# Patient Record
Sex: Male | Born: 1965 | Race: White | Hispanic: No | Marital: Married | Smoking: Never smoker
Health system: Southern US, Community
[De-identification: ages and names within clinical notes are randomized; demographics above are authoritative.]

## PROBLEM LIST (undated history)

## (undated) DIAGNOSIS — M549 Dorsalgia, unspecified: Secondary | ICD-10-CM

## (undated) DIAGNOSIS — Z9989 Dependence on other enabling machines and devices: Secondary | ICD-10-CM

## (undated) DIAGNOSIS — Z8619 Personal history of other infectious and parasitic diseases: Secondary | ICD-10-CM

## (undated) DIAGNOSIS — T7840XA Allergy, unspecified, initial encounter: Secondary | ICD-10-CM

## (undated) DIAGNOSIS — R12 Heartburn: Secondary | ICD-10-CM

## (undated) DIAGNOSIS — R1310 Dysphagia, unspecified: Secondary | ICD-10-CM

## (undated) DIAGNOSIS — M2550 Pain in unspecified joint: Secondary | ICD-10-CM

## (undated) DIAGNOSIS — Z87442 Personal history of urinary calculi: Secondary | ICD-10-CM

## (undated) DIAGNOSIS — K219 Gastro-esophageal reflux disease without esophagitis: Secondary | ICD-10-CM

## (undated) DIAGNOSIS — E7800 Pure hypercholesterolemia, unspecified: Secondary | ICD-10-CM

## (undated) DIAGNOSIS — G4733 Obstructive sleep apnea (adult) (pediatric): Secondary | ICD-10-CM

## (undated) DIAGNOSIS — N189 Chronic kidney disease, unspecified: Secondary | ICD-10-CM

## (undated) DIAGNOSIS — G4730 Sleep apnea, unspecified: Secondary | ICD-10-CM

## (undated) DIAGNOSIS — R0602 Shortness of breath: Secondary | ICD-10-CM

## (undated) DIAGNOSIS — J302 Other seasonal allergic rhinitis: Secondary | ICD-10-CM

## (undated) DIAGNOSIS — I10 Essential (primary) hypertension: Secondary | ICD-10-CM

## (undated) DIAGNOSIS — M7989 Other specified soft tissue disorders: Secondary | ICD-10-CM

## (undated) HISTORY — DX: Dependence on other enabling machines and devices: Z99.89

## (undated) HISTORY — DX: Dorsalgia, unspecified: M54.9

## (undated) HISTORY — DX: Pain in unspecified joint: M25.50

## (undated) HISTORY — PX: UPPER GASTROINTESTINAL ENDOSCOPY: SHX188

## (undated) HISTORY — DX: Other specified soft tissue disorders: M79.89

## (undated) HISTORY — DX: Allergy, unspecified, initial encounter: T78.40XA

## (undated) HISTORY — PX: INNER EAR SURGERY: SHX679

## (undated) HISTORY — DX: Personal history of other infectious and parasitic diseases: Z86.19

## (undated) HISTORY — DX: Shortness of breath: R06.02

## (undated) HISTORY — DX: Essential (primary) hypertension: I10

## (undated) HISTORY — DX: Sleep apnea, unspecified: G47.30

## (undated) HISTORY — DX: Dysphagia, unspecified: R13.10

## (undated) HISTORY — DX: Morbid (severe) obesity due to excess calories: E66.01

## (undated) HISTORY — DX: Heartburn: R12

## (undated) HISTORY — DX: Chronic kidney disease, unspecified: N18.9

## (undated) HISTORY — DX: Obstructive sleep apnea (adult) (pediatric): G47.33

## (undated) HISTORY — DX: Pure hypercholesterolemia, unspecified: E78.00

## (undated) HISTORY — DX: Personal history of urinary calculi: Z87.442

---

## 2008-06-27 ENCOUNTER — Emergency Department (HOSPITAL_BASED_OUTPATIENT_CLINIC_OR_DEPARTMENT_OTHER): Admission: EM | Admit: 2008-06-27 | Discharge: 2008-06-27 | Payer: Self-pay | Admitting: Emergency Medicine

## 2008-06-27 ENCOUNTER — Ambulatory Visit: Payer: Self-pay | Admitting: Diagnostic Radiology

## 2011-06-30 ENCOUNTER — Emergency Department (INDEPENDENT_AMBULATORY_CARE_PROVIDER_SITE_OTHER): Payer: PRIVATE HEALTH INSURANCE

## 2011-06-30 ENCOUNTER — Emergency Department (HOSPITAL_BASED_OUTPATIENT_CLINIC_OR_DEPARTMENT_OTHER)
Admission: EM | Admit: 2011-06-30 | Discharge: 2011-06-30 | Disposition: A | Discharge status: Home / Self Care | Payer: PRIVATE HEALTH INSURANCE | Attending: Emergency Medicine | Admitting: Emergency Medicine

## 2011-06-30 ENCOUNTER — Encounter (HOSPITAL_BASED_OUTPATIENT_CLINIC_OR_DEPARTMENT_OTHER): Payer: Self-pay | Admitting: *Deleted

## 2011-06-30 DIAGNOSIS — N200 Calculus of kidney: Secondary | ICD-10-CM

## 2011-06-30 DIAGNOSIS — N201 Calculus of ureter: Secondary | ICD-10-CM

## 2011-06-30 DIAGNOSIS — M5137 Other intervertebral disc degeneration, lumbosacral region: Secondary | ICD-10-CM | POA: Insufficient documentation

## 2011-06-30 DIAGNOSIS — M51379 Other intervertebral disc degeneration, lumbosacral region without mention of lumbar back pain or lower extremity pain: Secondary | ICD-10-CM | POA: Insufficient documentation

## 2011-06-30 DIAGNOSIS — N23 Unspecified renal colic: Secondary | ICD-10-CM

## 2011-06-30 DIAGNOSIS — M5126 Other intervertebral disc displacement, lumbar region: Secondary | ICD-10-CM

## 2011-06-30 DIAGNOSIS — R109 Unspecified abdominal pain: Secondary | ICD-10-CM | POA: Insufficient documentation

## 2011-06-30 DIAGNOSIS — N2889 Other specified disorders of kidney and ureter: Secondary | ICD-10-CM

## 2011-06-30 HISTORY — DX: Other seasonal allergic rhinitis: J30.2

## 2011-06-30 HISTORY — DX: Gastro-esophageal reflux disease without esophagitis: K21.9

## 2011-06-30 LAB — URINALYSIS, ROUTINE W REFLEX MICROSCOPIC
Ketones, ur: NEGATIVE mg/dL
Leukocytes, UA: NEGATIVE
Nitrite: NEGATIVE
Protein, ur: NEGATIVE mg/dL
Urobilinogen, UA: 0.2 mg/dL (ref 0.0–1.0)

## 2011-06-30 LAB — BASIC METABOLIC PANEL
Calcium: 9.2 mg/dL (ref 8.4–10.5)
Creatinine, Ser: 1.1 mg/dL (ref 0.50–1.35)
GFR calc Af Amer: >90 mL/min (ref >90)
Sodium: 138 mEq/L (ref 135–145)

## 2011-06-30 LAB — CBC
HCT: 45.2 % (ref 39.0–52.0)
MCHC: 34.3 g/dL (ref 30.0–36.0)
Platelets: 138 10*3/uL — ABNORMAL LOW (ref 150–400)
RDW: 13.6 % (ref 11.5–15.5)
WBC: 12.3 10*3/uL — ABNORMAL HIGH (ref 4.0–10.5)

## 2011-06-30 LAB — DIFFERENTIAL
Basophils Absolute: 0 10*3/uL (ref 0.0–0.1)
Basophils Relative: 0 % (ref 0–1)
Eosinophils Relative: 1 % (ref 0–5)
Lymphocytes Relative: 14 % (ref 12–46)
Monocytes Absolute: 1 10*3/uL (ref 0.1–1.0)
Neutro Abs: 9.5 10*3/uL — ABNORMAL HIGH (ref 1.7–7.7)

## 2011-06-30 LAB — URINE MICROSCOPIC-ADD ON

## 2011-06-30 MED ORDER — KETOROLAC TROMETHAMINE 30 MG/ML IJ SOLN
30.0000 mg | Freq: Once | INTRAMUSCULAR | Status: AC
  Administered 2011-06-30: 30 mg via INTRAVENOUS
  Filled 2011-06-30: qty 1

## 2011-06-30 MED ORDER — OXYCODONE-ACETAMINOPHEN 5-325 MG PO TABS: 1.0000 | ORAL_TABLET | ORAL | Status: AC | PRN

## 2011-06-30 MED ORDER — TAMSULOSIN HCL 0.4 MG PO CAPS: 0.4000 mg | ORAL_CAPSULE | Freq: Every day | ORAL | Status: DC

## 2011-06-30 MED ORDER — ONDANSETRON HCL 4 MG/2ML IJ SOLN
4.0000 mg | Freq: Once | INTRAMUSCULAR | Status: AC
  Administered 2011-06-30: 4 mg via INTRAVENOUS
  Filled 2011-06-30: qty 2

## 2011-06-30 MED ORDER — ONDANSETRON HCL 4 MG PO TABS: 4.0000 mg | ORAL_TABLET | Freq: Four times a day (QID) | ORAL | Status: AC

## 2011-06-30 MED ORDER — SODIUM CHLORIDE 0.9 % IV BOLUS (SEPSIS)
1000.0000 mL | Freq: Once | INTRAVENOUS | Status: AC
  Administered 2011-06-30: 1000 mL via INTRAVENOUS

## 2011-06-30 MED ORDER — MORPHINE SULFATE 4 MG/ML IJ SOLN
4.0000 mg | Freq: Once | INTRAMUSCULAR | Status: AC
  Administered 2011-06-30: 4 mg via INTRAVENOUS
  Filled 2011-06-30: qty 1

## 2011-06-30 NOTE — ED Provider Notes (Signed)
History     CSN: [id number]  Arrival date & time 06/30/11  0844   First MD Initiated Contact with Patient 06/30/11 [phone]      Chief Complaint  Patient presents with  . Flank Pain    (Consider location/radiation/quality/duration/timing/severity/associated sxs/prior treatment) HPI Pt woke with R flank pain radiating to R side and abdomen this morning associated with nausea. Decreased urination, no hematuria. Pt has had renal stones in the past and pain feels similar. No D/C, fever chills.  Past Medical History  Diagnosis Date  . Asthma   . Seasonal allergies   . GERD (gastroesophageal reflux disease)   . Kidney stone   . Gout     Past Surgical History  Procedure Date  . Inner ear surgery     No family history on file.  History  Substance Use Topics  . Smoking status: Never Smoker   . Smokeless tobacco: Not on file  . Alcohol Use: No      Review of Systems  Constitutional: Negative for fever and chills.  Gastrointestinal: Positive for nausea, vomiting and abdominal pain. Negative for diarrhea and constipation.  Genitourinary: Positive for flank pain and difficulty urinating. Negative for dysuria, frequency, hematuria, penile pain and testicular pain.  Musculoskeletal: Positive for back pain.    Allergies  Review of patient's allergies indicates no known allergies.  Home Medications   Current Outpatient Rx  Name Route Sig Dispense Refill  . CETIRIZINE HCL 10 MG PO TABS Oral Take 10 mg by mouth daily.    Marland Kitchen ESOMEPRAZOLE MAGNESIUM 20 MG PO PACK Oral Take 20 mg by mouth daily before breakfast.    . ONDANSETRON HCL 4 MG PO TABS Oral Take 1 tablet (4 mg total) by mouth every 6 (six) hours. 12 tablet 0  . OXYCODONE-ACETAMINOPHEN 5-325 MG PO TABS Oral Take 1 tablet by mouth every 4 (four) hours as needed for pain. 15 tablet 0  . TAMSULOSIN HCL 0.4 MG PO CAPS Oral Take 1 capsule (0.4 mg total) by mouth daily. 30 capsule 0    BP 134/82  Pulse 68  Temp(Src) 97.7 F  (36.5 C) (Oral)  Resp 20  SpO2 96%  Physical Exam  Nursing note and vitals reviewed. Constitutional: He is oriented to person, place, and time. He appears well-developed and well-nourished. No distress.  HENT:  Head: Normocephalic and atraumatic.  Mouth/Throat: Oropharynx is clear and moist.  Eyes: EOM are normal. Pupils are equal, round, and reactive to light.  Neck: Normal range of motion. Neck supple.  Cardiovascular: Normal rate and regular rhythm.   Pulmonary/Chest: Effort normal and breath sounds normal. No respiratory distress. He has no wheezes. He has no rales.  Abdominal: Soft. Bowel sounds are normal. He exhibits no mass. There is tenderness (Mild RUQ/RLQ ttp. no rebound or guarding). There is no rebound and no guarding.  Musculoskeletal: Normal range of motion. He exhibits tenderness (mild R flank ttp, no midline ttp. ). He exhibits no edema.  Neurological: He is alert and oriented to person, place, and time.  Skin: Skin is warm and dry. No rash noted. No erythema.  Psychiatric: He has a normal mood and affect. His behavior is normal.    ED Course  Procedures (including critical care time)  Labs Reviewed  CBC - Abnormal; Notable for the following:    WBC 12.3 (*)    Platelets 138 (*)    All other components within normal limits  DIFFERENTIAL - Abnormal; Notable for the following:  Neutro Abs 9.5 (*)    All other components within normal limits  BASIC METABOLIC PANEL - Abnormal; Notable for the following:    Glucose, Bld 143 (*)    GFR calc non Af Amer 80 (*)    All other components within normal limits  URINALYSIS, ROUTINE W REFLEX MICROSCOPIC   Ct Abdomen Pelvis Wo Contrast  06/30/2011  *RADIOLOGY REPORT*  Clinical Data: Right-sided flank pain, nausea, history of renal stones  CT ABDOMEN AND PELVIS WITHOUT CONTRAST  Technique:  Multidetector CT imaging of the abdomen and pelvis was performed following the standard protocol without intravenous contrast.   Comparison: None.  Findings:  The lack of intravenous contrast disability to evaluate solid abdominal organs.  Normal hepatic contour.  Normal noncontrast appearance of the gallbladder.  No ascites.  There is an approximately 2.5 mm stone within the superior aspect of the right ureter (axial image 52, series 2; coronal image 48, series four) which results in mild upstream ureterectasis and pelvicaliectasis.  There is minimal amount of asymmetric right- sided perinephric stranding.  Incidental note is made of additional punctate (1-2 mm) nonobstructing stone within the superior pole right kidney (axial image 37, series 2).  No left-sided renal stones or urinary obstruction.  No stones are seen within the urinary bladder.  Normal noncontrast appearance of the prostate. No free fluid in the pelvis.  Normal noncontrast appearance of the adrenal glands, pancreas and spleen.  Scattered minimal colonic diverticulosis without evidence of diverticulitis.  The bowel is otherwise normal in course and caliber without wall thickening or evidence of obstruction.  The appendix is not definitively identified, however there is no inflammatory change within the right lower abdominal quadrant.  No pneumoperitoneum, pneumatosis or portal venous gas.  Normal caliber of the abdominal aorta.  Scattered shoddy retroperitoneal lymph nodes are not enlarged by CT criteria with index left common iliac node measuring 8 mm in short axis diameter (image 60, series 2) presumably reactive in etiology.  Limited visualization of the lower thorax demonstrates bibasilar ground-glass atelectasis.  No pleural effusion.  Normal heart size. No pericardial effusion.  Bilateral pars defects of L5 with approximately 1.3 cm of anterolisthesis of L5 upon S1 (grade 1-2) and associated moderate to severe degenerative change of L5 - S1.  IMPRESSION:  1.  Approximately 2.5 mm stone within the superior aspect of the right ureter results in mild upstream  ureterectasis and pelvicaliectasis. 2.  Additional nonobstructing right sided nephrolithiasis. 3.  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  Original Report Authenticated By: Waynard Reeds, M.D.     1. Renal colic on right side       MDM   Pt's pain is improved. D/C home with symptomatic control. F/u with urology for persistent pain or concerns. Return for fever, chills, worsening symptoms or concerns       Loren Racer, MD 06/30/11 [phone]

## 2011-06-30 NOTE — ED Notes (Signed)
Sudden onset flank pain with nausea and vomiting

## 2011-06-30 NOTE — Discharge Instructions (Signed)
Kidney Stones Kidney stones (ureteral lithiasis) are deposits that form inside your kidneys. The intense pain is caused by the stone moving through the urinary tract. When the stone moves, the ureter goes into spasm around the stone. The stone is usually passed in the urine.  CAUSES   A disorder that makes certain neck glands produce too much parathyroid hormone (primary hyperparathyroidism).   A buildup of uric acid crystals.   Narrowing (stricture) of the ureter.   A kidney obstruction present at birth (congenital obstruction).   Previous surgery on the kidney or ureters.   Numerous kidney infections.  SYMPTOMS   Feeling sick to your stomach (nauseous).   Throwing up (vomiting).   Blood in the urine (hematuria).   Pain that usually spreads (radiates) to the groin.   Frequency or urgency of urination.  DIAGNOSIS   Taking a history and physical exam.   Blood or urine tests.   Computerized X-ray scan (CT scan).   Occasionally, an examination of the inside of the urinary bladder (cystoscopy) is performed.  TREATMENT   Observation.   Increasing your fluid intake.   Surgery may be needed if you have severe pain or persistent obstruction.  The size, location, and chemical composition are all important variables that will determine the proper choice of action for you. Talk to your caregiver to better understand your situation so that you will minimize the risk of injury to yourself and your kidney.  HOME CARE INSTRUCTIONS   Drink enough water and fluids to keep your urine clear or pale yellow.   Strain all urine through the provided strainer. Keep all particulate matter and stones for your caregiver to see. The stone causing the pain may be as small as a grain of salt. It is very important to use the strainer each and every time you pass your urine. The collection of your stone will allow your caregiver to analyze it and verify that a stone has actually passed.   Only take  over-the-counter or prescription medicines for pain, discomfort, or fever as directed by your caregiver.   Make a follow-up appointment with your caregiver as directed.   Get follow-up X-rays if required. The absence of pain does not always mean that the stone has passed. It may have only stopped moving. If the urine remains completely obstructed, it can cause loss of kidney function or even complete destruction of the kidney. It is your responsibility to make sure X-rays and follow-ups are completed. Ultrasounds of the kidney can show blockages and the status of the kidney. Ultrasounds are not associated with any radiation and can be performed easily in a matter of minutes.  SEEK IMMEDIATE MEDICAL CARE IF:   Pain cannot be controlled with the prescribed medicine.   You have a fever.   The severity or intensity of pain increases over 18 hours and is not relieved by pain medicine.   You develop a new onset of abdominal pain.   You feel faint or pass out.  MAKE SURE YOU:   Understand these instructions.   Will watch your condition.   Will get help right away if you are not doing well or get worse.  Document Released: 03/02/2005 Document Revised: 02/19/2011 Document Reviewed: 06/28/2009 [employer] Patient Information 2012 [employer]. 

## 2014-01-02 ENCOUNTER — Emergency Department (HOSPITAL_BASED_OUTPATIENT_CLINIC_OR_DEPARTMENT_OTHER)
Admission: EM | Admit: 2014-01-02 | Discharge: 2014-01-02 | Disposition: A | Discharge status: Home / Self Care | Payer: BC Managed Care – PPO | Attending: Emergency Medicine | Admitting: Emergency Medicine

## 2014-01-02 ENCOUNTER — Encounter (HOSPITAL_BASED_OUTPATIENT_CLINIC_OR_DEPARTMENT_OTHER): Payer: Self-pay | Admitting: Emergency Medicine

## 2014-01-02 ENCOUNTER — Emergency Department (HOSPITAL_BASED_OUTPATIENT_CLINIC_OR_DEPARTMENT_OTHER): Payer: BC Managed Care – PPO

## 2014-01-02 DIAGNOSIS — Z8739 Personal history of other diseases of the musculoskeletal system and connective tissue: Secondary | ICD-10-CM | POA: Insufficient documentation

## 2014-01-02 DIAGNOSIS — Z79899 Other long term (current) drug therapy: Secondary | ICD-10-CM | POA: Diagnosis not present

## 2014-01-02 DIAGNOSIS — J45909 Unspecified asthma, uncomplicated: Secondary | ICD-10-CM | POA: Insufficient documentation

## 2014-01-02 DIAGNOSIS — R109 Unspecified abdominal pain: Secondary | ICD-10-CM | POA: Diagnosis present

## 2014-01-02 DIAGNOSIS — N200 Calculus of kidney: Secondary | ICD-10-CM | POA: Diagnosis not present

## 2014-01-02 DIAGNOSIS — R10A0 Flank pain, unspecified side: Secondary | ICD-10-CM

## 2014-01-02 DIAGNOSIS — K219 Gastro-esophageal reflux disease without esophagitis: Secondary | ICD-10-CM | POA: Insufficient documentation

## 2014-01-02 LAB — BASIC METABOLIC PANEL
Anion gap: 15 (ref 5–15)
BUN: 16 mg/dL (ref 6–23)
CALCIUM: 9.9 mg/dL (ref 8.4–10.5)
CO2: 28 mEq/L (ref 19–32)
Chloride: 99 mEq/L (ref 96–112)
Creatinine, Ser: 1.3 mg/dL (ref 0.50–1.35)
GFR, EST AFRICAN AMERICAN: 74 mL/min — AB (ref >90)
GFR, EST NON AFRICAN AMERICAN: 64 mL/min — AB (ref >90)
Glucose, Bld: 129 mg/dL — ABNORMAL HIGH (ref 70–99)
POTASSIUM: 3.8 meq/L (ref 3.7–5.3)
SODIUM: 142 meq/L (ref 137–147)

## 2014-01-02 LAB — URINALYSIS, ROUTINE W REFLEX MICROSCOPIC
Glucose, UA: NEGATIVE mg/dL
KETONES UR: 15 mg/dL — AB
NITRITE: NEGATIVE
PROTEIN: 100 mg/dL — AB
Specific Gravity, Urine: 1.026 (ref 1.005–1.030)
Urobilinogen, UA: 0.2 mg/dL (ref 0.0–1.0)
pH: 5.5 (ref 5.0–8.0)

## 2014-01-02 LAB — CBC
HCT: 47.1 % (ref 39.0–52.0)
Hemoglobin: 15.6 g/dL (ref 13.0–17.0)
MCH: 28.2 pg (ref 26.0–34.0)
MCHC: 33.1 g/dL (ref 30.0–36.0)
MCV: 85 fL (ref 78.0–100.0)
Platelets: 165 10*3/uL (ref 150–400)
RBC: 5.54 MIL/uL (ref 4.22–5.81)
RDW: 13.7 % (ref 11.5–15.5)
WBC: 19 10*3/uL — ABNORMAL HIGH (ref 4.0–10.5)

## 2014-01-02 LAB — URINE MICROSCOPIC-ADD ON

## 2014-01-02 MED ORDER — KETOROLAC TROMETHAMINE 30 MG/ML IJ SOLN
30.0000 mg | Freq: Once | INTRAMUSCULAR | Status: AC
  Administered 2014-01-02: 30 mg via INTRAVENOUS
  Filled 2014-01-02: qty 1

## 2014-01-02 MED ORDER — OXYCODONE-ACETAMINOPHEN 5-325 MG PO TABS: 1.0000 | ORAL_TABLET | Freq: Four times a day (QID) | ORAL | Status: DC | PRN

## 2014-01-02 MED ORDER — TAMSULOSIN HCL 0.4 MG PO CAPS: 0.4000 mg | ORAL_CAPSULE | Freq: Every day | ORAL | Status: DC

## 2014-01-02 MED ORDER — MORPHINE SULFATE 4 MG/ML IJ SOLN
4.0000 mg | Freq: Once | INTRAMUSCULAR | Status: AC
  Administered 2014-01-02: 4 mg via INTRAVENOUS
  Filled 2014-01-02: qty 1

## 2014-01-02 NOTE — Discharge Instructions (Signed)

## 2014-01-02 NOTE — ED Provider Notes (Signed)
CSN: [id number]     Arrival date & time 01/02/14  1757 History   First MD Initiated Contact with Patient 01/02/14 1806     Chief Complaint  Patient presents with  . Flank Pain     (Consider location/radiation/quality/duration/timing/severity/associated sxs/prior Treatment) Patient is a 48 y.o. male presenting with flank pain. The history is provided by the patient.  Flank Pain This is a new problem. The current episode started 6 to 12 hours ago. The problem occurs constantly. The problem has been gradually worsening. Associated symptoms include abdominal pain. Pertinent negatives include no chest pain, no headaches and no shortness of breath. Nothing aggravates the symptoms. Nothing relieves the symptoms.    Past Medical History  Diagnosis Date  . Asthma   . Seasonal allergies   . GERD (gastroesophageal reflux disease)   . Kidney stone   . Gout    Past Surgical History  Procedure Laterality Date  . Inner ear surgery     History reviewed. No pertinent family history. History  Substance Use Topics  . Smoking status: Never Smoker   . Smokeless tobacco: Not on file  . Alcohol Use: No    Review of Systems  Constitutional: Negative for fever and chills.  Respiratory: Negative for shortness of breath.   Cardiovascular: Negative for chest pain.  Gastrointestinal: Positive for abdominal pain.  Genitourinary: Positive for flank pain.  Neurological: Negative for headaches.  All other systems reviewed and are negative.     Allergies  Review of patient's allergies indicates no known allergies.  Home Medications   Prior to Admission medications   Medication Sig Start Date End Date Taking? Authorizing Provider  cetirizine (ZYRTEC) 10 MG tablet Take 10 mg by mouth daily.    Historical Provider, MD  esomeprazole (NEXIUM) 20 MG packet Take 20 mg by mouth daily before breakfast.    Historical Provider, MD  Tamsulosin HCl (FLOMAX) 0.4 MG CAPS Take 1 capsule (0.4 mg total) by  mouth daily. 06/30/11   Julianne Rice, MD   BP 141/86  Pulse 76  Temp(Src) 97.9 F (36.6 C) (Oral)  Resp 16  Ht 5\' 9"  (1.753 m)  Wt 290 lb (131.543 kg)  BMI 42.81 kg/m2  SpO2 100% Physical Exam  Nursing note and vitals reviewed. Constitutional: He is oriented to person, place, and time. He appears well-developed and well-nourished. No distress.  HENT:  Head: Normocephalic and atraumatic.  Mouth/Throat: Oropharynx is clear and moist. No oropharyngeal exudate.  Eyes: EOM are normal. Pupils are equal, round, and reactive to light.  Neck: Normal range of motion. Neck supple.  Cardiovascular: Normal rate and regular rhythm.  Exam reveals no friction rub.   No murmur heard. Pulmonary/Chest: Effort normal and breath sounds normal. No respiratory distress. He has no wheezes. He has no rales.  Abdominal: He exhibits no distension. There is tenderness (mild L CVA). There is no rebound. Hernia confirmed negative in the right inguinal area and confirmed negative in the left inguinal area.  Genitourinary: Right testis shows no mass, no swelling and no tenderness. Left testis shows no mass, no swelling and no tenderness.  Musculoskeletal: Normal range of motion. He exhibits no edema.  Neurological: He is alert and oriented to person, place, and time.  Skin: No rash noted. He is not diaphoretic.    ED Course  Procedures (including critical care time) Labs Review Labs Reviewed  URINALYSIS, ROUTINE W REFLEX MICROSCOPIC  CBC  BASIC METABOLIC PANEL    Imaging Review Ct Renal Stone Study  01/02/2014   CLINICAL DATA:  48 year old male with acute left flank pain radiating to the groin. Initial encounter. Partial history of kidney stones.  EXAM: CT ABDOMEN AND PELVIS WITHOUT CONTRAST  TECHNIQUE: Multidetector CT imaging of the abdomen and pelvis was performed following the standard protocol without IV contrast.  COMPARISON:  CT Abdomen and Pelvis 06/30/2011.  FINDINGS: Stable and negative lung  bases.  No pericardial or pleural effusion.  Chronic grade 2 anterolisthesis at L5-S1 associated with chronic L5 pars fractures and advanced disc and posterior element degeneration. No acute osseous abnormality identified.  No pelvic free fluid.  Decompressed bladder and distal colon.  Rib done did not but otherwise negative sigmoid. Decompressed left colon and much of the transverse colon. Decompressed right colon. Appendix not delineated today. No pericecal inflammation. Negative terminal ileum. No dilated small bowel.  Negative non contrast stomach, duodenum, liver, gallbladder, spleen, pancreas and adrenal glands. No abdominal free fluid.  Negative right kidney and ureter.  No left perinephric stranding. Only mild hydronephrosis is evident, but the left ureter is asymmetrically enlarged with mild stranding as it extends into the pelvis. On series 2, image 78 there is a punctate (1-2 mm) calculus within the distal ureter located within 15 mm of the left ureterovesical junction. No other left nephrolithiasis. No lymphadenopathy.  IMPRESSION: 1. Acute obstructive uropathy on the left with a 2 mm calculus located within 15 mm of the left UVJ. 2. No other acute findings. 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   Electronically Signed   By: Lars Pinks M.D.   On: 01/02/2014 19:19     EKG Interpretation None      MDM   Final diagnoses:  Flank pain  Kidney stone    17M presents with left lower back pain, radiates to groin, testicles, feels similar to prior kidney stones. No fevers, nausea, vomiting. No diarrhea. On exam, mild L CVA tenderness, normal GU exam. Will scan for stone.  Scan shows 2 mm mid-ureter stone. Feeling better on re-exam. Stable for discharge.  I have reviewed all labs and imaging and considered them in my medical decision making.   Evelina Bucy, MD 01/02/14 2035

## 2014-01-02 NOTE — ED Notes (Signed)
Pt c/o left flank pain radiates around to groin x 5 hrs. HX kidney stones

## 2015-12-05 ENCOUNTER — Telehealth: Payer: Self-pay | Admitting: Family Medicine

## 2015-12-05 ENCOUNTER — Ambulatory Visit (INDEPENDENT_AMBULATORY_CARE_PROVIDER_SITE_OTHER): Payer: BLUE CROSS/BLUE SHIELD | Admitting: Family Medicine

## 2015-12-05 ENCOUNTER — Encounter: Payer: Self-pay | Admitting: Family Medicine

## 2015-12-05 VITALS — BP 130/76 | HR 72 | Temp 98.1°F | Ht 69.0 in | Wt 284.4 lb

## 2015-12-05 DIAGNOSIS — Z131 Encounter for screening for diabetes mellitus: Secondary | ICD-10-CM

## 2015-12-05 DIAGNOSIS — G4733 Obstructive sleep apnea (adult) (pediatric): Secondary | ICD-10-CM

## 2015-12-05 DIAGNOSIS — Z9989 Dependence on other enabling machines and devices: Secondary | ICD-10-CM

## 2015-12-05 DIAGNOSIS — Z13220 Encounter for screening for lipoid disorders: Secondary | ICD-10-CM | POA: Diagnosis not present

## 2015-12-05 DIAGNOSIS — Z8709 Personal history of other diseases of the respiratory system: Secondary | ICD-10-CM | POA: Diagnosis not present

## 2015-12-05 DIAGNOSIS — K219 Gastro-esophageal reflux disease without esophagitis: Secondary | ICD-10-CM | POA: Diagnosis not present

## 2015-12-05 HISTORY — DX: Morbid (severe) obesity due to excess calories: E66.01

## 2015-12-05 HISTORY — DX: Obstructive sleep apnea (adult) (pediatric): G47.33

## 2015-12-05 LAB — LIPID PANEL
CHOL/HDL RATIO: 5
Cholesterol: 163 mg/dL (ref 0–200)
HDL: 33.9 mg/dL — ABNORMAL LOW (ref >39.00)
LDL Cholesterol: 103 mg/dL — ABNORMAL HIGH (ref 0–99)
NonHDL: 129.08
TRIGLYCERIDES: 130 mg/dL (ref 0.0–149.0)
VLDL: 26 mg/dL (ref 0.0–40.0)

## 2015-12-05 LAB — COMPREHENSIVE METABOLIC PANEL
ALK PHOS: 75 U/L (ref 39–117)
ALT: 33 U/L (ref 0–53)
AST: 23 U/L (ref 0–37)
Albumin: 4.1 g/dL (ref 3.5–5.2)
BILIRUBIN TOTAL: 0.5 mg/dL (ref 0.2–1.2)
BUN: 16 mg/dL (ref 6–23)
CALCIUM: 9.5 mg/dL (ref 8.4–10.5)
CO2: 29 mEq/L (ref 19–32)
Chloride: 107 mEq/L (ref 96–112)
Creatinine, Ser: 1.09 mg/dL (ref 0.40–1.50)
GFR: 76.32 mL/min (ref >60.00)
GLUCOSE: 90 mg/dL (ref 70–99)
Potassium: 3.7 mEq/L (ref 3.5–5.1)
Sodium: 143 mEq/L (ref 135–145)
TOTAL PROTEIN: 7.6 g/dL (ref 6.0–8.3)

## 2015-12-05 LAB — HEMOGLOBIN A1C: Hgb A1c MFr Bld: 5.9 % (ref 4.6–6.5)

## 2015-12-05 NOTE — Telephone Encounter (Signed)
Caller name:[name] Relationship to patient:self Can be reached:[phone] Pharmacy:  Reason for call:Return call

## 2015-12-05 NOTE — Patient Instructions (Signed)
You can try to replace your Nexium with Pepcid (famotidine) 20 mg twice daily or Zantac (ranitidine) 150 twice daily. Give it 1-2 weeks before going back to Nexium.

## 2015-12-05 NOTE — Progress Notes (Signed)
Pre visit review using our clinic review tool, if applicable. No additional management support is needed unless otherwise documented below in the visit note. 

## 2015-12-05 NOTE — Progress Notes (Signed)
Chief Complaint  Patient presents with  . Establish Care       New Patient Visit SUBJECTIVE: HPI: Travis Fields is an 50 y.o.male who is being seen for establishing care.  The patient was previously seen at another office where his PCP left.   GERD Hx of GERD, has been on Nexium for years. He has heard negative things about being on a proton pump inhibitor. He has never transitioned to an H2 blocker. He denies any unintentional weight loss, nausea, vomiting, or belly pain. His symptoms are well controlled currently.  He does have a history of asthma. This was confirmed with pulmonary function testing per the patient. He has not needed an inhaler for approximately 30 years. He has never had a pneumonia vaccine.  The patient has lost around 20 pounds intentionally over the past year. He continues to work towards a healthier weight.  No Known Allergies  Past Medical History:  Diagnosis Date  . Asthma   . GERD (gastroesophageal reflux disease)   . Gout   . History of chicken pox   . History of kidney stones   . Morbid obesity ([name]) 12/05/2015  . OSA on CPAP 12/05/2015  . Seasonal allergies    Past Surgical History:  Procedure Laterality Date  . INNER EAR SURGERY     Social History   Social History  . Marital status: Married   Social History Main Topics  . Smoking status: Never Smoker  . Smokeless tobacco: No  . Alcohol use No  . Drug use: No   History reviewed. No pertinent family history.   Current Outpatient Prescriptions:  .  cetirizine (ZYRTEC) 10 MG tablet, Take 10 mg by mouth daily., Disp: , Rfl:  .  esomeprazole (NEXIUM) 20 MG capsule, Take 40 mg by mouth daily at 12 noon., Disp: , Rfl:   ROS GI: Denies Nausea, vomiting, or abdominal pain   Const: Denies Unintentional weight loss    OBJECTIVE: BP 130/76 (BP Location: Left Arm, Patient Position: Sitting, Cuff Size: Large)   Pulse 72   Temp 98.1 F (36.7 C) (Oral)   Ht 5\' 9"  (1.753 m)   Wt 284 lb 6.4 oz  (129 kg)   SpO2 96%   BMI 42.00 kg/m   Constitutional: -  VS reviewed -  Well developed, well nourished, appears stated age -  No apparent distress  Psychiatric: -  Oriented to person, place, and time -  Memory intact -  Affect and mood normal -  Fluent conversation, good eye contact -  Judgment and insight age appropriate  Eye: -  Conjunctivae clear, no discharge -  Pupils symmetric, round, reactive to light  ENMT: -  Ears are patent b/l without erythema or discharge. TM's are shiny and clear b/l without evidence of effusion or infection. -  Oral mucosa without lesions, tongue and uvula midline    Tonsils not enlarged, no erythema, no exudate, trachea midline    Pharynx moist, no lesions, no erythema  Neck: -  No gross swelling, no palpable masses -  Thyroid midline, not enlarged, mobile, no palpable masses  Cardiovascular: -  RRR, no murmurs -  No LE edema  Respiratory: -  Normal respiratory effort, no accessory muscle use, no retraction -  Breath sounds equal, no wheezes, no ronchi, no crackles  Gastrointestinal: -  Bowel sounds normal -  No tenderness, no distention, no guarding, no masses  Skin: -  No significant lesion on inspection -  Warm and dry to  palpation   ASSESSMENT/PLAN: Gastroesophageal reflux disease, esophagitis presence not specified  Morbid obesity, unspecified obesity type ([place]) - Plan: Comprehensive metabolic panel  History of asthma  Screening for diabetes mellitus (DM) - Plan: Hemoglobin A1c  Screening cholesterol level - Plan: Lipid panel  OSA on CPAP  Patient instructed to sign release of records form from his previous PCP. Orders as above. Consider a pneumonia vaccine as well as the flu shot. He refuses both at this time. Patient should return in 9 months for a complete physical. Sooner if there are lab abnormalities. The patient voiced understanding and agreement to the plan.   [place], DO 12/05/15  8:19 AM

## 2015-12-06 NOTE — Telephone Encounter (Signed)
Spoke with the pt and informed him of recent lab results and note.  Pt verbalized understanding.  Pt stated that he is getting ready to start a diet and exercise program.//AB/CMA

## 2015-12-06 NOTE — Telephone Encounter (Signed)
Called and [facility] @ 8:46am @ [phone]) asking the pt to RTC regarding lab results.//AB/CMA

## 2015-12-30 IMAGING — CT CT RENAL STONE PROTOCOL
2 of 3 series · 16 of 46 positions shown, 18 images · non-contrast
Comparison: CT Abdomen and Pelvis 06/30/2011.

CLINICAL DATA: 48-year-old male with acute left flank pain
radiating to the groin. Initial encounter. Partial history of kidney
stones.

EXAM:
CT ABDOMEN AND PELVIS WITHOUT CONTRAST
TECHNIQUE: Multidetector CT imaging of the abdomen and pelvis was performed
following the standard protocol without IV contrast.

[Series 2: renal stone > 200 lbs 5.0 b31f · axial · 0.96mm/px · z∈[-519,-79]mm · 13 of 102 slices shown, 15 images]
[im 7/102  soft-tissue]
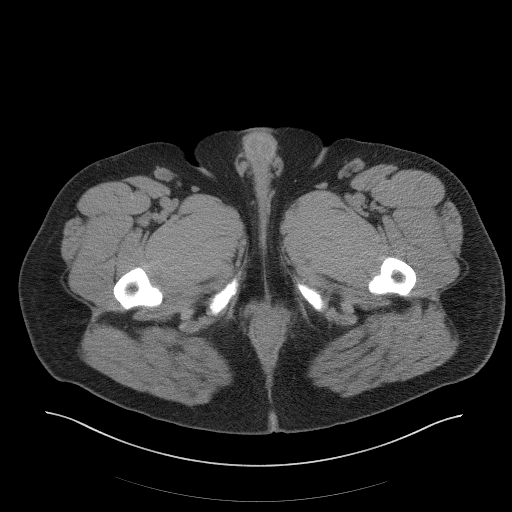
[im 7/102  bone]
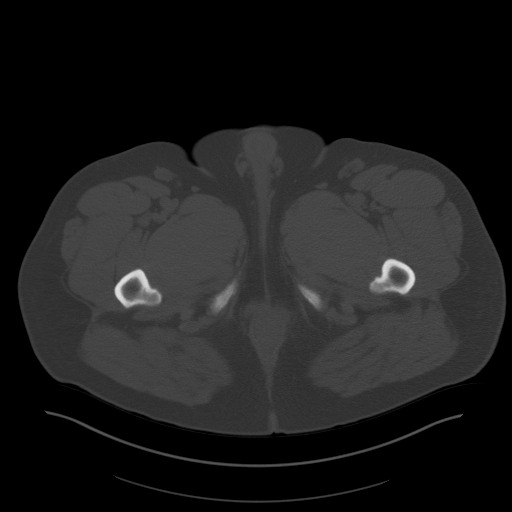
[im 14/102  soft-tissue]
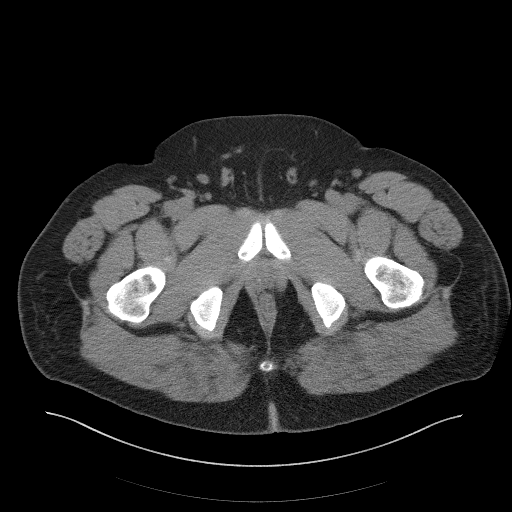
[im 20/102  soft-tissue]
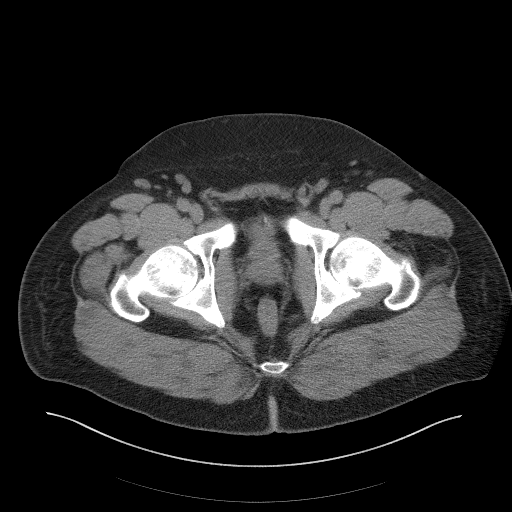
[im 30/102  soft-tissue]
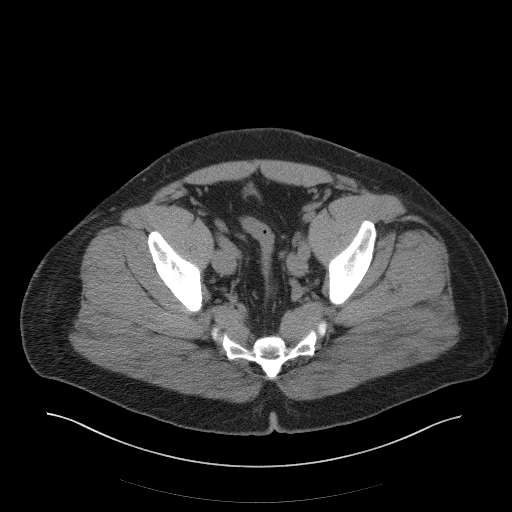
[im 36/102  soft-tissue]
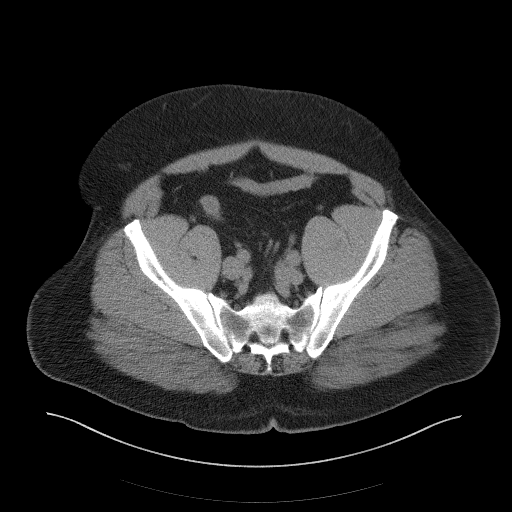
[im 43/102  soft-tissue]
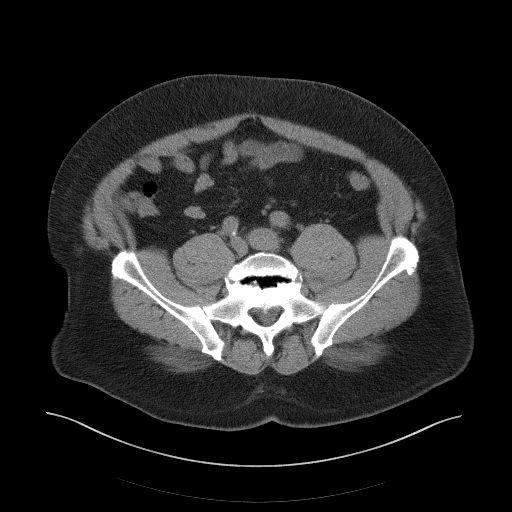
[im 53/102  soft-tissue]
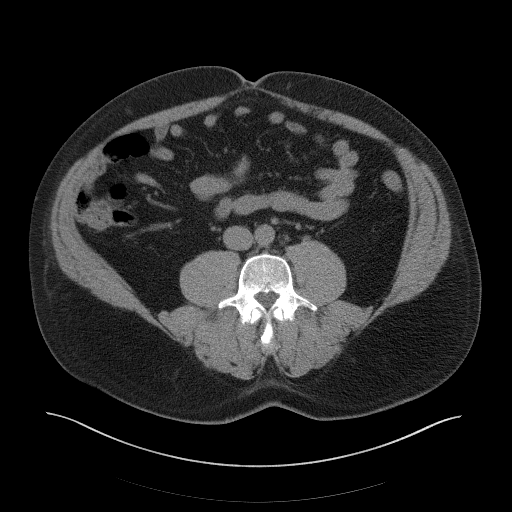
[im 59/102  soft-tissue]
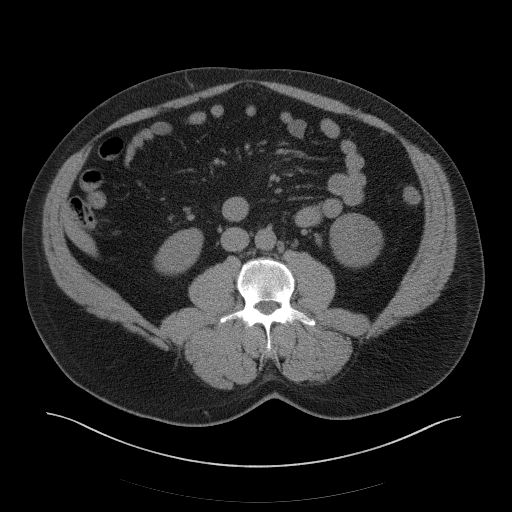
[im 66/102  soft-tissue]
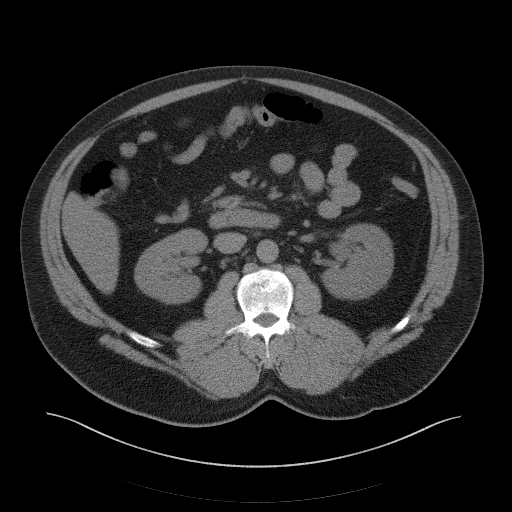
[im 66/102  bone]
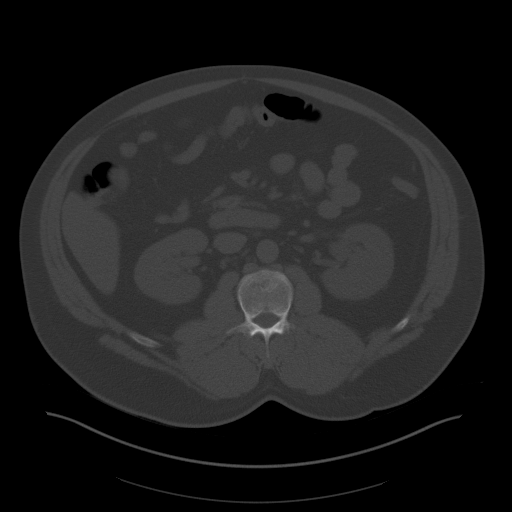
[im 72/102  soft-tissue]
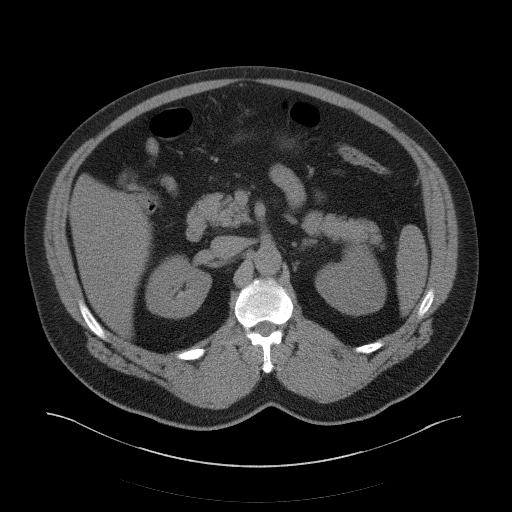
[im 82/102  soft-tissue]
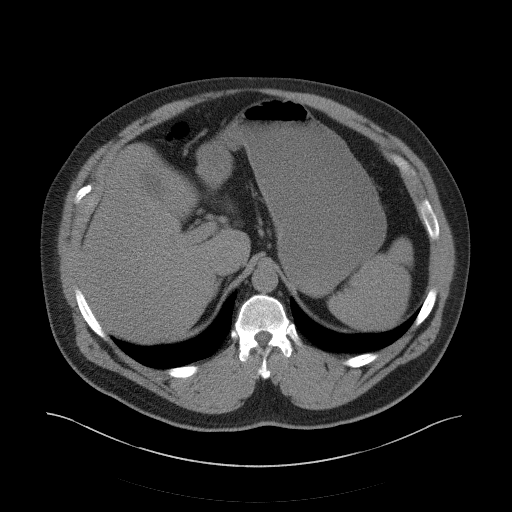
[im 88/102  soft-tissue]
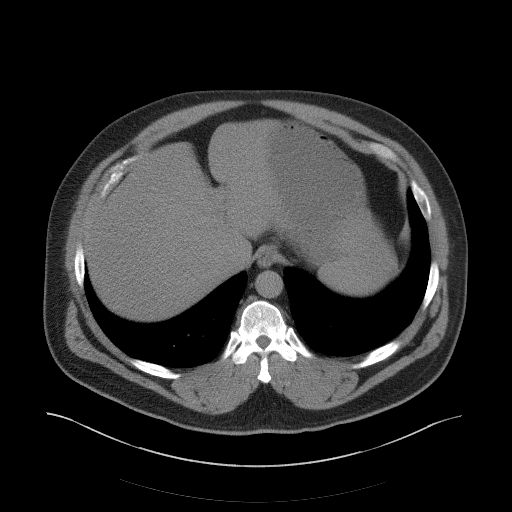
[im 95/102  soft-tissue]
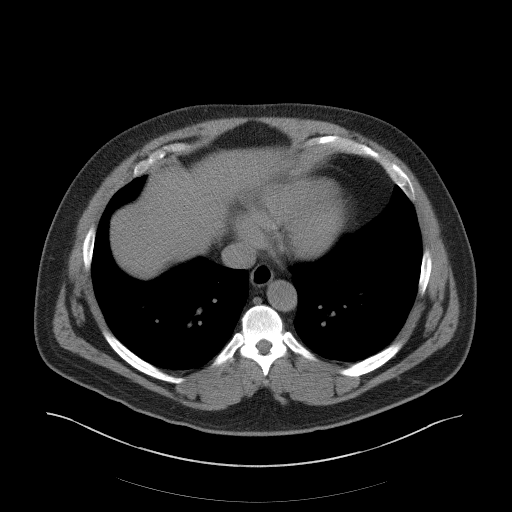

[Series 5: renal stone 3.0 coronal · coronal · 0.91mm/px · 3 of 109 slices shown]
[im 37/109  soft-tissue]
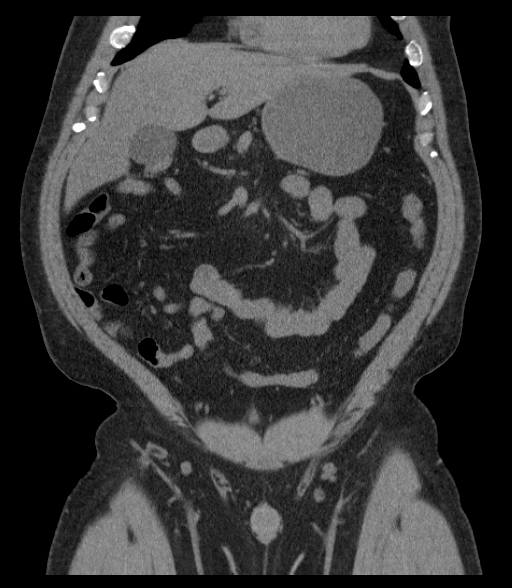
[im 49/109  soft-tissue]
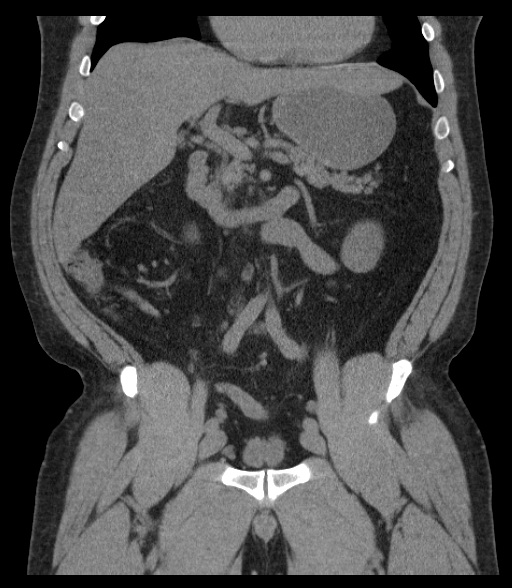
[im 61/109  soft-tissue]
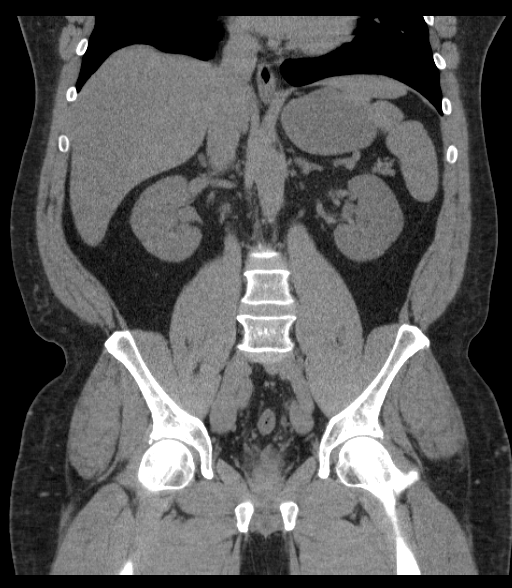

[16 of 46 positions shown; findings below may reference images not displayed]

FINDINGS: Stable and negative lung bases.  No pericardial or pleural effusion.

Chronic grade 2 anterolisthesis at L5-S1 associated with chronic L5
pars fractures and advanced disc and posterior element degeneration.
No acute osseous abnormality identified.

No pelvic free fluid.  Decompressed bladder and distal colon.

Rib done did not but otherwise negative sigmoid. Decompressed left
colon and much of the transverse colon. Decompressed right colon.
Appendix not delineated today. No pericecal inflammation. Negative
terminal ileum. No dilated small bowel.

Negative non contrast stomach, duodenum, liver, gallbladder, spleen,
pancreas and adrenal glands. No abdominal free fluid.

Negative right kidney and ureter.

No left perinephric stranding. Only mild hydronephrosis is evident,
but the left ureter is asymmetrically enlarged with mild stranding
as it extends into the pelvis. On series 2, image 78 there is a
punctate (1-2 mm) calculus within the distal ureter located within
15 mm of the left ureterovesical junction. No other left
nephrolithiasis. No lymphadenopathy.
IMPRESSION: 1. Acute obstructive uropathy on the left with a 2 mm calculus
located within 15 mm of the left UVJ.
2. No other acute findings. Chronic L5-S1 spondylolysis and grade 2
spondylolisthesis.

## 2016-09-03 ENCOUNTER — Encounter: Payer: BLUE CROSS/BLUE SHIELD | Admitting: Family Medicine

## 2017-01-11 ENCOUNTER — Ambulatory Visit (INDEPENDENT_AMBULATORY_CARE_PROVIDER_SITE_OTHER): Payer: BLUE CROSS/BLUE SHIELD | Admitting: Family Medicine

## 2017-01-11 ENCOUNTER — Encounter: Payer: Self-pay | Admitting: Family Medicine

## 2017-01-11 VITALS — BP 148/94 | HR 82 | Temp 98.6°F | Ht 69.0 in | Wt 312.4 lb

## 2017-01-11 DIAGNOSIS — R030 Elevated blood-pressure reading, without diagnosis of hypertension: Secondary | ICD-10-CM

## 2017-01-11 DIAGNOSIS — L03116 Cellulitis of left lower limb: Secondary | ICD-10-CM

## 2017-01-11 MED ORDER — CEPHALEXIN 500 MG PO CAPS: 500.0000 mg | ORAL_CAPSULE | Freq: Three times a day (TID) | ORAL | 0 refills | Status: AC

## 2017-01-11 NOTE — Progress Notes (Signed)
Pre visit review using our clinic review tool, if applicable. No additional management support is needed unless otherwise documented below in the visit note. 

## 2017-01-11 NOTE — Progress Notes (Signed)
Chief Complaint  Patient presents with  . Leg Swelling    left lower leg swelling and pain for 10 days    Candice Camp here for left leg swelling.  Duration: 1 week Hx of prolonged bedrest, recent surgery, travel or injury? No Pain the calf? No SOB? No Personal or family history of clot or bleeding disorder? No Hx of heart failure, renal failure, hepatic failure? No  Redness, warmth and pain over lower leg. Swelling also.   ROS:  MSK- +leg swelling, no pain Lungs- no SOB  Past Medical History:  Diagnosis Date  . Asthma   . GERD (gastroesophageal reflux disease)   . Gout   . History of chicken pox   . History of kidney stones   . Morbid obesity ([place]) 12/05/2015  . OSA on CPAP 12/05/2015  . Seasonal allergies    No family history on file. Past Surgical History:  Procedure Laterality Date  . INNER EAR SURGERY      Current Outpatient Prescriptions:  .  cetirizine (ZYRTEC) 10 MG tablet, Take 10 mg by mouth daily., Disp: , Rfl:  .  esomeprazole (NEXIUM) 20 MG capsule, Take 40 mg by mouth daily at 12 noon., Disp: , Rfl:  .  cephALEXin (KEFLEX) 500 MG capsule, Take 1 capsule (500 mg total) by mouth 3 (three) times daily., Disp: 21 capsule, Rfl: 0  BP (!) 148/94 (BP Location: Left Arm, Patient Position: Sitting, Cuff Size: Large)   Pulse 82   Temp 98.6 F (37 C) (Oral)   Ht 5\' 9"  (1.753 m)   Wt (!) 312 lb 6 oz (141.7 kg)   SpO2 96%   BMI 46.13 kg/m  Gen- awake, alert, appears stated age Heart- RRR, no murmurs, +LLE edema 2+ Lungs- CTAB, normal effort w/o accessory muscle use MSK- no calf pain, neg Homan's Skin- see below; Erythema evident, +warmth and TTP, no fluctuance Psych: Age appropriate judgment and insight.  Media Information     Document Information   Photos    01/11/2017 15:22  Attached To:  Office Visit on 01/11/17 with Shelda Pal, DO  [place], Crosby Oyster, DO  [facility]   [place]    01/11/2017 15:22  Attached To:  Office Visit on 01/11/17 with Shelda Pal, DO  [place], Crosby Oyster, DO  [facility]   [place]    01/11/2017 15:22  Attached To:  Office Visit on 01/11/17 with Shelda Pal, [place], Crosby Oyster, DO  [facility]     Cellulitis of left lower extremity - Plan: cephALEXin (KEFLEX) 500 MG capsule  Elevated blood pressure reading  Orders as above. Keflex, no hx of MRSA. Warning s/s's verbalized and written down. Low concern for DVT given exam and hx. Counseled on diet and exercise.  F/u 2 weeks for BP check or CPE. Pt voiced understanding and agreement to the plan.  [place], DO 01/11/17  3:50 PM

## 2017-01-11 NOTE — Patient Instructions (Signed)
Things to look out for: fevers, drainage, increasing pain, spreading redness.  Consider getting a home blood pressure monitor. Bring readings and device to appt if you do.

## 2017-01-18 ENCOUNTER — Telehealth: Payer: Self-pay | Admitting: Family Medicine

## 2017-01-18 NOTE — Telephone Encounter (Signed)
Pt called in to be advised. Pt says that he was prescribed Cephalexin and has completed the medication and is no longer having symptoms but would like to know if he need to follow up.    Informed pt per his AVS. He said that he would like to be sure that the infection is gone.    Please advise.

## 2017-01-18 NOTE — Telephone Encounter (Signed)
Called the patient informed of PCP instructions. He felt better to schedule an appointment and did so for 01/20/2017 at 7:30 AM. Swelling is doing better, but still has a little and would feel better seeing MD.

## 2017-01-18 NOTE — Telephone Encounter (Signed)
I am relieved to hear that things are doing better. If he would like to schedule an appt to have it looked at, I can do that, but I am not specifically asking that he come in to do that if he does not wish to. TY.

## 2017-01-20 ENCOUNTER — Encounter: Payer: Self-pay | Admitting: Family Medicine

## 2017-01-20 ENCOUNTER — Ambulatory Visit (INDEPENDENT_AMBULATORY_CARE_PROVIDER_SITE_OTHER): Payer: BLUE CROSS/BLUE SHIELD | Admitting: Family Medicine

## 2017-01-20 VITALS — BP 142/92 | HR 80 | Temp 97.9°F | Ht 69.0 in | Wt 309.1 lb

## 2017-01-20 DIAGNOSIS — Z09 Encounter for follow-up examination after completed treatment for conditions other than malignant neoplasm: Secondary | ICD-10-CM

## 2017-01-20 NOTE — Patient Instructions (Addendum)
Things to look out for: fevers, drainage, returof redness/pain, or if things are still not improving by Friday, let me know.   Get a home BP cuff.  Keep physically active and moving.  Elevating your legs will help.  Consider rescheduling your nurse visit back a week or 2.

## 2017-01-20 NOTE — Progress Notes (Signed)
Pre visit review using our clinic review tool, if applicable. No additional management support is needed unless otherwise documented below in the visit note. 

## 2017-01-20 NOTE — Progress Notes (Signed)
Chief Complaint  Patient presents with  . Leg Swelling    left    Subjective: Patient is a 51 y.o. male here for f/u rash.  Continues having swelling and some pain. It is improving overall, still having swelling. No redness or pain any longer.   Objective: BP (!) 142/92 (BP Location: Left Arm, Patient Position: Sitting, Cuff Size: Large)   Pulse 80   Temp 97.9 F (36.6 C) (Oral)   Ht 5\' 9"  (1.753 m)   Wt (!) 309 lb 2 oz (140.2 kg)   SpO2 94%   BMI 45.65 kg/m  General: Awake, appears stated age Heart: RRR, 1+ pitting edema up to knees b/l Lungs: CTAB, no rales, wheezes or rhonchi. No accessory muscle use MSK: No calf TTP b/l, gait normal Psych: Age appropriate judgment and insight, normal affect and mood  Assessment and Plan: Follow-up for resolved condition  Pt wanted to be sure that things are improving and that he doesn't need further tx. OK to try compression stockings, elevate legs, today no evidence of infection.  F/u as originally planned for BP. He has yet to get home monitor.  The patient voiced understanding and agreement to the plan.  Crosby Oyster [facility] 01/20/17  7:54 AM

## 2017-01-27 ENCOUNTER — Ambulatory Visit: Payer: BLUE CROSS/BLUE SHIELD

## 2017-02-10 ENCOUNTER — Ambulatory Visit: Payer: BLUE CROSS/BLUE SHIELD

## 2017-02-19 ENCOUNTER — Ambulatory Visit (INDEPENDENT_AMBULATORY_CARE_PROVIDER_SITE_OTHER): Payer: BLUE CROSS/BLUE SHIELD | Admitting: Family Medicine

## 2017-02-19 VITALS — BP 146/94 | HR 77

## 2017-02-19 DIAGNOSIS — R030 Elevated blood-pressure reading, without diagnosis of hypertension: Secondary | ICD-10-CM | POA: Diagnosis not present

## 2017-02-19 NOTE — Progress Notes (Signed)
Pre visit review using our clinic tool,if applicable. No additional management support is needed unless otherwise documented below in the visit note.   Patient in for BP check per order from Dr. Nani Ravens dated 01/20/17.  Patient currently not taking hypertensive medications.  BP today = 146/94  Per Dr. Nani Ravens patient has 1 of 2 choices. Patient may modify is lifestyle/diet and return in 1 month for OV or he can start Norvasc 5 mg daily and return for OV in 1 month.  Patient chose to try the lifestyle/diet modification first by loosing weight increasing exercise and reducing the amount of his salt intake. Appointment scheduled with Dr. Nani Ravens in 1 month.

## 2017-02-19 NOTE — Progress Notes (Signed)
Noted. Agree with above.  [place], DO 02/19/17 4:10 PM

## 2017-03-22 ENCOUNTER — Ambulatory Visit: Payer: BLUE CROSS/BLUE SHIELD | Admitting: Family Medicine

## 2017-03-29 ENCOUNTER — Encounter: Payer: Self-pay | Admitting: Family Medicine

## 2017-03-29 ENCOUNTER — Ambulatory Visit (INDEPENDENT_AMBULATORY_CARE_PROVIDER_SITE_OTHER): Payer: PRIVATE HEALTH INSURANCE | Admitting: Family Medicine

## 2017-03-29 VITALS — BP 142/98 | HR 78 | Temp 98.2°F | Ht 69.0 in | Wt 303.4 lb

## 2017-03-29 DIAGNOSIS — I10 Essential (primary) hypertension: Secondary | ICD-10-CM

## 2017-03-29 HISTORY — DX: Essential (primary) hypertension: I10

## 2017-03-29 MED ORDER — AMLODIPINE BESYLATE 5 MG PO TABS: 5.0000 mg | ORAL_TABLET | Freq: Every day | ORAL | 3 refills | Status: DC

## 2017-03-29 NOTE — Progress Notes (Signed)
Pre visit review using our clinic review tool, if applicable. No additional management support is needed unless otherwise documented below in the visit note. 

## 2017-03-29 NOTE — Progress Notes (Signed)
Chief Complaint  Patient presents with  . Hypertension    Subjective Travis Fields is a 52 y.o. male who presents for hypertension follow up. We have had our eye on his bp over the past 3 mo. He does monitor home blood pressures. Blood pressures ranging from 130-140's/80-90's on average. He is not currently on any medication.  He is adhering to a healthy diet overall. Current exercise: walking   Past Medical History:  Diagnosis Date  . Asthma   . GERD (gastroesophageal reflux disease)   . Gout   . History of chicken pox   . History of kidney stones   . Morbid obesity ([place]) 12/05/2015  . OSA on CPAP 12/05/2015  . Seasonal allergies    Review of Systems Cardiovascular: no chest pain Respiratory:  no shortness of breath  Exam BP (!) 142/98 (BP Location: Left Arm, Patient Position: Sitting, Cuff Size: Large)   Pulse 78   Temp 98.2 F (36.8 C) (Oral)   Ht 5\' 9"  (1.753 m)   Wt (!) 303 lb 6 oz (137.6 kg)   SpO2 96%   BMI 44.80 kg/m  General:  well developed, well nourished, in no apparent distress Skin: warm, no pallor or diaphoresis Eyes: pupils equal and round, sclera anicteric without injection Heart: RRR, no bruits, no LE edema Lungs: clear to auscultation, no accessory muscle use Psych: well oriented with normal range of affect and appropriate judgment/insight  Essential hypertension - Plan: amLODipine (NORVASC) 5 MG tablet  Orders as above. Counseled on diet and exercise- he has lost 9 lbs since the start of the year and is doing well. I will add on Norvasc to lower him to ideal level and as he continues to lose weight, will monitor and wean off in future.  F/u in 2 mo. The patient voiced understanding and agreement to the plan.  [place], DO 03/29/17  8:31 AM

## 2017-03-29 NOTE — Patient Instructions (Signed)
Keep up the good work.  Keep checking your blood pressure intermittently and bring them back to your next appointment.   I don't foresee you being on this medicine for the rest of your life if you keep losing weight and staying active.  Let us know if you need anything.

## 2017-05-27 ENCOUNTER — Encounter: Payer: Self-pay | Admitting: Family Medicine

## 2017-05-27 ENCOUNTER — Ambulatory Visit (INDEPENDENT_AMBULATORY_CARE_PROVIDER_SITE_OTHER): Payer: PRIVATE HEALTH INSURANCE | Admitting: Family Medicine

## 2017-05-27 VITALS — BP 120/82 | HR 67 | Temp 98.2°F | Ht 69.0 in | Wt 277.4 lb

## 2017-05-27 DIAGNOSIS — I10 Essential (primary) hypertension: Secondary | ICD-10-CM

## 2017-05-27 NOTE — Progress Notes (Signed)
Chief Complaint  Patient presents with  . Hypertension    Subjective Travis Fields is a 52 y.o. male who presents for hypertension follow up. He does monitor home blood pressures. Blood pressures ranging from 110's/60's on average. He is compliant with medication- amlodipine 5 mg/d. Patient has these side effects of medication: none He is adhering to a healthy diet overall. He is following the [place] plan.  Current exercise: walking; going to start to lift weights He has lost 25 lbs since his last visit, total of 35 lbs.   Past Medical History:  Diagnosis Date  . Asthma   . GERD (gastroesophageal reflux disease)   . Gout   . History of chicken pox   . History of kidney stones   . Morbid obesity ([place]) 12/05/2015  . OSA on CPAP 12/05/2015  . Seasonal allergies    Allergies No Known Allergies  Review of Systems Cardiovascular: no chest pain Respiratory:  no shortness of breath  Exam BP 120/82 (BP Location: Left Arm, Patient Position: Sitting, Cuff Size: Large)   Pulse 67   Temp 98.2 F (36.8 C) (Oral)   Ht 5\' 9"  (1.753 m)   Wt 277 lb 6 oz (125.8 kg)   SpO2 95%   BMI 40.96 kg/m  General:  well developed, well nourished, in no apparent distress Skin: warm, no pallor or diaphoresis Eyes: pupils equal and round, sclera anicteric without injection Heart: RRR, no bruits, no LE edema Lungs: clear to auscultation, no accessory muscle use Psych: well oriented with normal range of affect and appropriate judgment/insight  Essential hypertension  Stop Norvasc. Cont checking BP's.  Counseled on diet and exercise- he is doing excellent, encouraged to add weight lifting to routine. F/u in 3 mo for CPE. The patient voiced understanding and agreement to the plan.  [place], DO 05/27/17  8:27 AM

## 2017-05-27 NOTE — Patient Instructions (Signed)
Excellent work with your weight loss!  OK to check BP's 1-2 times per week.   Let us know if you need anything.

## 2017-05-27 NOTE — Progress Notes (Signed)
Pre visit review using our clinic review tool, if applicable. No additional management support is needed unless otherwise documented below in the visit note. 

## 2017-06-07 ENCOUNTER — Encounter: Payer: Self-pay | Admitting: Family Medicine

## 2017-06-07 ENCOUNTER — Ambulatory Visit (INDEPENDENT_AMBULATORY_CARE_PROVIDER_SITE_OTHER): Payer: PRIVATE HEALTH INSURANCE | Admitting: Family Medicine

## 2017-06-07 VITALS — BP 132/86 | HR 64 | Temp 97.4°F | Ht 69.0 in | Wt 272.4 lb

## 2017-06-07 DIAGNOSIS — M79671 Pain in right foot: Secondary | ICD-10-CM | POA: Diagnosis not present

## 2017-06-07 HISTORY — DX: Pain in right foot: M79.671

## 2017-06-07 NOTE — Progress Notes (Signed)
Musculoskeletal Exam  Patient: Travis Fields DOB: August 28, 1965  DOS: 06/07/2017  SUBJECTIVE:  Chief Complaint:   Chief Complaint  Patient presents with  . Leg Pain    right leg pain     Travis Fields is a 52 y.o.  male for evaluation and treatment of R foot pain.   Onset:  5 days ago. No inj. Has increased activity prior to pain with physical activity. Location: stop of R foot Character:  aching  Progression of issue:  Is slightly better Wrapping makes it better. Associated symptoms: slight swelling last week, has gone down Treatment: to date has been: ice, wrapping it, NSAIDs.   Neurovascular symptoms: no  ROS: Musculoskeletal/Extremities: +R foot pain  Past Medical History:  Diagnosis Date  . Asthma   . GERD (gastroesophageal reflux disease)   . Gout   . History of chicken pox   . History of kidney stones   . Morbid obesity ([name]) 12/05/2015  . OSA on CPAP 12/05/2015  . Seasonal allergies     Objective: VITAL SIGNS: BP 132/86 (BP Location: Left Arm, Patient Position: Sitting, Cuff Size: Large)   Pulse 64   Temp (!) 97.4 F (36.3 C) (Oral)   Ht 5\' 9"  (1.753 m)   Wt 272 lb 6 oz (123.5 kg)   SpO2 94%   BMI 40.22 kg/m  Constitutional: Well formed, well developed. No acute distress. Cardiovascular: Brisk cap refill Thorax & Lungs: No accessory muscle use Musculoskeletal: R foot.   Normal active range of motion: yes.   Normal passive range of motion: yes Tenderness to palpation: yes, over extensor tendon for great toe Deformity: no Ecchymosis: no Tests positive: none Tests negative: anterior drawer of R ankle, squeeze test Neurologic: Normal sensory function.  Psychiatric: Normal mood. Age appropriate judgment and insight. Alert & oriented x 3.    Assessment:  Right foot pain  Plan: Flat shoe given, ice, Tylenol, NSAIDs, stretches/exercises. F/u prn. The patient voiced understanding and agreement to the plan.   [place], DO 06/07/17   12:01 PM

## 2017-06-07 NOTE — Progress Notes (Signed)
Pre visit review using our clinic review tool, if applicable. No additional management support is needed unless otherwise documented below in the visit note. 

## 2017-06-07 NOTE — Patient Instructions (Signed)
I think you strained a tendon in your foot. Try to ease into physical activity when able.   Keep up the excellent work with your weight loss. You look great!  Ice/cold pack over area for 10-15 min twice daily.  OK to take Tylenol 1000 mg (2 extra strength tabs) or 975 mg (3 regular strength tabs) every 6 hours as needed.  Ibuprofen 400-600 mg (2-3 over the counter strength tabs) every 6 hours as needed for pain.  The following are designed for the ankle, but I think can help with your issue as well.   Ankle Exercises It is normal to feel mild stretching, pulling, tightness, or discomfort as you do these exercises, but you should stop right away if you feel sudden pain or your pain gets worse.  Stretching and range of motion exercises These exercises warm up your muscles and joints and improve the movement and flexibility of your ankle. These exercises also help to relieve pain, numbness, and tingling. Exercise A: Dorsiflexion/Plantar Flexion   1. Sit with your affected knee straight or bent. Do not rest your foot on anything. 2. Flex your affected ankle to tilt the top of your foot toward your shin. 3. Hold this position for 5 seconds. 4. Point your toes downward to tilt the top of your foot away from your shin. 5. Hold this position for 5 seconds. Repeat 2 times. Complete this exercise 3 times per week. Exercise B: Ankle Alphabet   1. Sit with your affected foot supported at your lower leg. ? Do not rest your foot on anything. ? Make sure your foot has room to move freely. 2. Think of your affected foot as a paintbrush, and move your foot to trace each letter of the alphabet in the air. Keep your hip and knee still while you trace. Make the letters as large as you can without increasing any discomfort. 3. Trace every letter from A to Z. Repeat 2 times. Complete this exercise 3 times per week. Strengthening exercises These exercises build strength and endurance in your ankle.  Endurance is the ability to use your muscles for a long time, even after they get tired. Exercise D: Dorsiflexors   1. Secure a rubber exercise band or tube to an object, such as a table leg, that will stay still when the band is pulled. Secure the other end around your affected foot. 2. Sit on the floor, facing the object with your affected leg extended. The band or tube should be slightly tense when your foot is relaxed. 3. Slowly flex your affected ankle and toes to bring your foot toward you. 4. Hold this position for 3 seconds.  5. Slowly return your foot to the starting position, controlling the band as you do that. Do a total of 10 repetitions. Repeat 2 times. Complete this exercise 3 times per week. Exercise E: Plantar Flexors   1. Sit on the floor with your affected leg extended. 2. Loop a rubber exercise band or tube around the ball of your affected foot. The ball of your foot is on the walking surface, right under your toes. The band or tube should be slightly tense when your foot is relaxed. 3. Slowly point your toes downward, pushing them away from you. 4. Hold this position for 3 seconds. 5. Slowly release the tension in the band or tube, controlling smoothly until your foot is back in the starting position. Repeat for a total of 10 repetitions. Repeat 2 times. Complete this exercise  3 times per week. Exercise F: Towel Curls   1. Sit in a chair on a non-carpeted surface, and put your feet on the floor. 2. Place a towel in front of your feet.  3. Keeping your heel on the floor, put your affected foot on the towel. 4. Pull the towel toward you by grabbing the towel with your toes and curling them under. Keep your heel on the floor. 5. Let your toes relax. 6. Grab the towel again. Keep going until the towel is completely underneath your foot. Repeat for a total of 10 repetitions. Repeat 2 times. Complete this exercise 3 times per week. Exercise G: Heel Raise ( Plantar  Flexors, Standing)    1. Stand with your feet shoulder-width apart. 2. Keep your weight spread evenly over the width of your feet while you rise up on your toes. Use a wall or table to steady yourself, but try not to use it for support. 3. If this exercise is too easy, try these options: ? Shift your weight toward your affected leg until you feel challenged. ? If told by your health care provider, lift your uninjured leg off the floor. 4. Hold this position for 3 seconds. Repeat for a total of 10 repetitions. Repeat 2 times. Complete this exercise 3 times per week. Exercise H: Tandem Walking 1. Stand with one foot directly in front of the other. 2. Slowly raise your back foot up, lifting your heel before your toes, and place it directly in front of your other foot. 3. Continue to walk in this heel-to-toe way for 10 steps or for as long as told by your health care provider. Have a countertop or wall nearby to use if needed to keep your balance, but try not to hold onto anything for support. Repeat 2 times. Complete this exercises 3 times per week. Make sure you discuss any questions you have with your health care provider. Document Released: 01/14/2005 Document Revised: 10/31/2015 Document Reviewed: 11/18/2014 Elsevier Interactive Patient Education  2018 [employer].

## 2017-09-06 ENCOUNTER — Encounter: Payer: PRIVATE HEALTH INSURANCE | Admitting: Family Medicine

## 2017-10-25 ENCOUNTER — Ambulatory Visit (INDEPENDENT_AMBULATORY_CARE_PROVIDER_SITE_OTHER): Payer: PRIVATE HEALTH INSURANCE | Admitting: Family Medicine

## 2017-10-25 ENCOUNTER — Encounter: Payer: Self-pay | Admitting: Family Medicine

## 2017-10-25 VITALS — BP 132/82 | HR 72 | Temp 98.5°F | Ht 69.0 in | Wt 298.0 lb

## 2017-10-25 DIAGNOSIS — Z23 Encounter for immunization: Secondary | ICD-10-CM

## 2017-10-25 DIAGNOSIS — Z0000 Encounter for general adult medical examination without abnormal findings: Secondary | ICD-10-CM

## 2017-10-25 LAB — COMPREHENSIVE METABOLIC PANEL
ALBUMIN: 4.5 g/dL (ref 3.5–5.2)
ALK PHOS: 71 U/L (ref 39–117)
ALT: 40 U/L (ref 0–53)
AST: 33 U/L (ref 0–37)
BILIRUBIN TOTAL: 1 mg/dL (ref 0.2–1.2)
BUN: 12 mg/dL (ref 6–23)
CALCIUM: 9.9 mg/dL (ref 8.4–10.5)
CO2: 30 mEq/L (ref 19–32)
Chloride: 102 mEq/L (ref 96–112)
Creatinine, Ser: 1.13 mg/dL (ref 0.40–1.50)
GFR: 72.66 mL/min (ref >60.00)
Glucose, Bld: 92 mg/dL (ref 70–99)
POTASSIUM: 4 meq/L (ref 3.5–5.1)
Sodium: 139 mEq/L (ref 135–145)
TOTAL PROTEIN: 7.6 g/dL (ref 6.0–8.3)

## 2017-10-25 LAB — LIPID PANEL
Cholesterol: 196 mg/dL (ref 0–200)
HDL: 43.6 mg/dL (ref >39.00)
LDL Cholesterol: 127 mg/dL — ABNORMAL HIGH (ref 0–99)
NonHDL: 151.94
TRIGLYCERIDES: 124 mg/dL (ref 0.0–149.0)
Total CHOL/HDL Ratio: 4
VLDL: 24.8 mg/dL (ref 0.0–40.0)

## 2017-10-25 LAB — HEMOGLOBIN A1C: HEMOGLOBIN A1C: 6.2 % (ref 4.6–6.5)

## 2017-10-25 MED ORDER — CLOTRIMAZOLE-BETAMETHASONE 1-0.05 % EX CREA: 1.0000 "application " | TOPICAL_CREAM | Freq: Two times a day (BID) | CUTANEOUS | 0 refills | Status: DC

## 2017-10-25 NOTE — Patient Instructions (Addendum)
Give [place] 2-3 business days to get the results of your labs back.   Get back in the swing of things with your eating.  Aim to do some physical exertion for 150 minutes per week. This is typically divided into 5 days per week, 30 minutes per day. The activity should be enough to get your heart rate up. Anything is better than nothing if you have time constraints.  Ice/cold pack over area for 10-15 min twice daily.   Plantar Fasciitis Stretches/exercises Do exercises exactly as told by your health care provider and adjust them as directed. It is normal to feel mild stretching, pulling, tightness, or discomfort as you do these exercises, but you should stop right away if you feel sudden pain or your pain gets worse.   Stretching and range of motion exercises These exercises warm up your muscles and joints and improve the movement and flexibility of your foot. These exercises also help to relieve pain.  Exercise A: Plantar fascia stretch 1. Sit with your left / right leg crossed over your opposite knee. 2. Hold your heel with one hand with that thumb near your arch. With your other hand, hold your toes and gently pull them back toward the top of your foot. You should feel a stretch on the bottom of your toes or your foot or both. 3. Hold this stretch for 30 seconds. 4. Slowly release your toes and return to the starting position. Repeat 2 times. Complete this exercise 3 times per week.  Exercise B: Gastroc, standing 1. Stand with your hands against a wall. 2. Extend your left / right leg behind you, and bend your front knee slightly. 3. Keeping your heels on the floor and keeping your back knee straight, shift your weight toward the wall without arching your back. You should feel a gentle stretch in your left / right calf. 4. Hold this position for 30 seconds. Repeat 2 times. Complete this exercise 3 times a week. Exercise C: Soleus, standing 1. Stand with your hands against a wall. 2. Extend  your left / right leg behind you, and bend your front knee slightly. 3. Keeping your heels on the floor, bend your back knee and slightly shift your weight over the back leg. You should feel a gentle stretch deep in your calf. 4. Hold this position for 30 seconds. Repeat 2 times. Complete this exercise 3 times per week. Exercise D: Gastrocsoleus, standing 1. Stand with the ball of your left / right foot on a step. The ball of your foot is on the walking surface, right under your toes. 2. Keep your other foot firmly on the same step. 3. Hold onto the wall or a railing for balance. 4. Slowly lift your other foot, allowing your body weight to press your heel down over the edge of the step. You should feel a stretch in your left / right calf. 5. Hold this position for 30 seconds. 6. Return both feet to the step. 7. Repeat this exercise with a slight bend in your left / right knee. Repeat 2 times with your left / right knee straight and 2times with your left / right knee bent. Complete this exercise 3 times a week.  Balance exercise This exercise builds your balance and strength control of your arch to help take pressure off your plantar fascia. Exercise E: Single leg stand 1. Without shoes, stand near a railing or in a doorway. You may hold onto the railing or door frame as  needed. 2. Stand on your left / right foot. Keep your big toe down on the floor and try to keep your arch lifted. Do not let your foot roll inward. 3. Hold this position for 30 seconds. 4. If this exercise is too easy, you can try it with your eyes closed or while standing on a pillow. Repeat 2 times. Complete this exercise 3 times per week. This information is not intended to replace advice given to you by your health care provider. Make sure you discuss any questions you have with your health care provider. Document Released: 03/02/2005 Document Revised: 11/05/2015 Document Reviewed: 01/14/2015 Elsevier Interactive Patient  Education  2017 [place] (ROM) AND STRETCHING EXERCISES - Low Back Pain Most people with lower back pain will find that their symptoms get worse with excessive bending forward (flexion) or arching at the lower back (extension). The exercises that will help resolve your symptoms will focus on the opposite motion.  If you have pain, numbness or tingling which travels down into your buttocks, leg or foot, the goal of the therapy is for these symptoms to move closer to your back and eventually resolve. Sometimes, these leg symptoms will get better, but your lower back pain may worsen. This is often an indication of progress in your rehabilitation. Be very alert to any changes in your symptoms and the activities in which you participated in the 24 hours prior to the change. Sharing this information with your caregiver will allow him or her to most efficiently treat your condition. These exercises may help you when beginning to rehabilitate your injury. Your symptoms may resolve with or without further involvement from your physician, physical therapist or athletic trainer. While completing these exercises, remember:   Restoring tissue flexibility helps normal motion to return to the joints. This allows healthier, less painful movement and activity.  An effective stretch should be held for at least 30 seconds.  A stretch should never be painful. You should only feel a gentle lengthening or release in the stretched tissue. FLEXION RANGE OF MOTION AND STRETCHING EXERCISES:  STRETCH - Flexion, Single Knee to Chest   Lie on a firm bed or floor with both legs extended in front of you.  Keeping one leg in contact with the floor, bring your opposite knee to your chest. Hold your leg in place by either grabbing behind your thigh or at your knee.  Pull until you feel a gentle stretch in your low back. Hold 30 seconds.  Slowly release your grasp and repeat the exercise with  the opposite side. Repeat 2 times. Complete this exercise 3 times per week.   STRETCH - Flexion, Double Knee to Chest  Lie on a firm bed or floor with both legs extended in front of you.  Keeping one leg in contact with the floor, bring your opposite knee to your chest.  Tense your stomach muscles to support your back and then lift your other knee to your chest. Hold your legs in place by either grabbing behind your thighs or at your knees.  Pull both knees toward your chest until you feel a gentle stretch in your low back. Hold 30 seconds.  Tense your stomach muscles and slowly return one leg at a time to the floor. Repeat 2 times. Complete this exercise 3 times per week.   STRETCH - Low Trunk Rotation  Lie on a firm bed or floor. Keeping your legs in front of you, bend  your knees so they are both pointed toward the ceiling and your feet are flat on the floor.  Extend your arms out to the side. This will stabilize your upper body by keeping your shoulders in contact with the floor.  Gently and slowly drop both knees together to one side until you feel a gentle stretch in your low back. Hold for 30 seconds.  Tense your stomach muscles to support your lower back as you bring your knees back to the starting position. Repeat the exercise to the other side. Repeat 2 times. Complete this exercise at least 3 times per week.   EXTENSION RANGE OF MOTION AND FLEXIBILITY EXERCISES:  STRETCH - Extension, Prone on Elbows   Lie on your stomach on the floor, a bed will be too soft. Place your palms about shoulder width apart and at the height of your head.  Place your elbows under your shoulders. If this is too painful, stack pillows under your chest.  Allow your body to relax so that your hips drop lower and make contact more completely with the floor.  Hold this position for 30 seconds.  Slowly return to lying flat on the floor. Repeat 2 times. Complete this exercise 3 times per week.    RANGE OF MOTION - Extension, Prone Press Ups  Lie on your stomach on the floor, a bed will be too soft. Place your palms about shoulder width apart and at the height of your head.  Keeping your back as relaxed as possible, slowly straighten your elbows while keeping your hips on the floor. You may adjust the placement of your hands to maximize your comfort. As you gain motion, your hands will come more underneath your shoulders.  Hold this position 30 seconds.  Slowly return to lying flat on the floor. Repeat 2 times. Complete this exercise 3 times per week.   RANGE OF MOTION- Quadruped, Neutral Spine   Assume a hands and knees position on a firm surface. Keep your hands under your shoulders and your knees under your hips. You may place padding under your knees for comfort.  Drop your head and point your tailbone toward the ground below you. This will round out your lower back like an angry cat. Hold this position for 30 seconds.  Slowly lift your head and release your tail bone so that your back sags into a large arch, like an old horse.  Hold this position for 30 seconds.  Repeat this until you feel limber in your low back.  Now, find your "sweet spot." This will be the most comfortable position somewhere between the two previous positions. This is your neutral spine. Once you have found this position, tense your stomach muscles to support your low back.  Hold this position for 30 seconds. Repeat 2 times. Complete this exercise 3 times per week.   STRENGTHENING EXERCISES - Low Back Sprain These exercises may help you when beginning to rehabilitate your injury. These exercises should be done near your "sweet spot." This is the neutral, low-back arch, somewhere between fully rounded and fully arched, that is your least painful position. When performed in this safe range of motion, these exercises can be used for people who have either a flexion or extension based injury. These  exercises may resolve your symptoms with or without further involvement from your physician, physical therapist or athletic trainer. While completing these exercises, remember:   Muscles can gain both the endurance and the strength needed for everyday activities through  controlled exercises.  Complete these exercises as instructed by your physician, physical therapist or athletic trainer. Increase the resistance and repetitions only as guided.  You may experience muscle soreness or fatigue, but the pain or discomfort you are trying to eliminate should never worsen during these exercises. If this pain does worsen, stop and make certain you are following the directions exactly. If the pain is still present after adjustments, discontinue the exercise until you can discuss the trouble with your caregiver.  STRENGTHENING - Deep Abdominals, Pelvic Tilt   Lie on a firm bed or floor. Keeping your legs in front of you, bend your knees so they are both pointed toward the ceiling and your feet are flat on the floor.  Tense your lower abdominal muscles to press your low back into the floor. This motion will rotate your pelvis so that your tail bone is scooping upwards rather than pointing at your feet or into the floor. With a gentle tension and even breathing, hold this position for 3 seconds. Repeat 2 times. Complete this exercise 3 times per week.   STRENGTHENING - Abdominals, Crunches   Lie on a firm bed or floor. Keeping your legs in front of you, bend your knees so they are both pointed toward the ceiling and your feet are flat on the floor. Cross your arms over your chest.  Slightly tip your chin down without bending your neck.  Tense your abdominals and slowly lift your trunk high enough to just clear your shoulder blades. Lifting higher can put excessive stress on the lower back and does not further strengthen your abdominal muscles.  Control your return to the starting position. Repeat 2  times. Complete this exercise 3 times per week.   STRENGTHENING - Quadruped, Opposite UE/LE Lift   Assume a hands and knees position on a firm surface. Keep your hands under your shoulders and your knees under your hips. You may place padding under your knees for comfort.  Find your neutral spine and gently tense your abdominal muscles so that you can maintain this position. Your shoulders and hips should form a rectangle that is parallel with the floor and is not twisted.  Keeping your trunk steady, lift your right hand no higher than your shoulder and then your left leg no higher than your hip. Make sure you are not holding your breath. Hold this position for 30 seconds.  Continuing to keep your abdominal muscles tense and your back steady, slowly return to your starting position. Repeat with the opposite arm and leg. Repeat 2 times. Complete this exercise 3 times per week.   STRENGTHENING - Abdominals and Quadriceps, Straight Leg Raise   Lie on a firm bed or floor with both legs extended in front of you.  Keeping one leg in contact with the floor, bend the other knee so that your foot can rest flat on the floor.  Find your neutral spine, and tense your abdominal muscles to maintain your spinal position throughout the exercise.  Slowly lift your straight leg off the floor about 6 inches for a count of 3, making sure to not hold your breath.  Still keeping your neutral spine, slowly lower your leg all the way to the floor. Repeat this exercise with each leg 2 times. Complete this exercise 3 times per week.  POSTURE AND BODY MECHANICS CONSIDERATIONS - Low Back Sprain Keeping correct posture when sitting, standing or completing your activities will reduce the stress put on different body tissues, allowing  injured tissues a chance to heal and limiting painful experiences. The following are general guidelines for improved posture.  While reading these guidelines, remember:  The exercises  prescribed by your provider will help you have the flexibility and strength to maintain correct postures.  The correct posture provides the best environment for your joints to work. All of your joints have less wear and tear when properly supported by a spine with good posture. This means you will experience a healthier, less painful body.  Correct posture must be practiced with all of your activities, especially prolonged sitting and standing. Correct posture is as important when doing repetitive low-stress activities (typing) as it is when doing a single heavy-load activity (lifting).  RESTING POSITIONS Consider which positions are most painful for you when choosing a resting position. If you have pain with flexion-based activities (sitting, bending, stooping, squatting), choose a position that allows you to rest in a less flexed posture. You would want to avoid curling into a fetal position on your side. If your pain worsens with extension-based activities (prolonged standing, working overhead), avoid resting in an extended position such as sleeping on your stomach. Most people will find more comfort when they rest with their spine in a more neutral position, neither too rounded nor too arched. Lying on a non-sagging bed on your side with a pillow between your knees, or on your back with a pillow under your knees will often provide some relief. Keep in mind, being in any one position for a prolonged period of time, no matter how correct your posture, can still lead to stiffness.  PROPER SITTING POSTURE In order to minimize stress and discomfort on your spine, you must sit with correct posture. Sitting with good posture should be effortless for a healthy body. Returning to good posture is a gradual process. Many people can work toward this most comfortably by using various supports until they have the flexibility and strength to maintain this posture on their own. When sitting with proper posture, your  ears will fall over your shoulders and your shoulders will fall over your hips. You should use the back of the chair to support your upper back. Your lower back will be in a neutral position, just slightly arched. You may place a small pillow or folded towel at the base of your lower back for  support.  When working at a desk, create an environment that supports good, upright posture. Without extra support, muscles tire, which leads to excessive strain on joints and other tissues. Keep these recommendations in mind:  CHAIR:  A chair should be able to slide under your desk when your back makes contact with the back of the chair. This allows you to work closely.  The chair's height should allow your eyes to be level with the upper part of your monitor and your hands to be slightly lower than your elbows.  BODY POSITION  Your feet should make contact with the floor. If this is not possible, use a foot rest.  Keep your ears over your shoulders. This will reduce stress on your neck and low back.  INCORRECT SITTING POSTURES  If you are feeling tired and unable to assume a healthy sitting posture, do not slouch or slump. This puts excessive strain on your back tissues, causing more damage and pain. Healthier options include:  Using more support, like a lumbar pillow.  Switching tasks to something that requires you to be upright or walking.  Talking a brief walk.  Lying down to rest in a neutral-spine position.  PROLONGED STANDING WHILE SLIGHTLY LEANING FORWARD  When completing a task that requires you to lean forward while standing in one place for a long time, place either foot up on a stationary 2-4 inch high object to help maintain the best posture. When both feet are on the ground, the lower back tends to lose its slight inward curve. If this curve flattens (or becomes too large), then the back and your other joints will experience too much stress, tire more quickly, and can cause  pain.  CORRECT STANDING POSTURES Proper standing posture should be assumed with all daily activities, even if they only take a few moments, like when brushing your teeth. As in sitting, your ears should fall over your shoulders and your shoulders should fall over your hips. You should keep a slight tension in your abdominal muscles to brace your spine. Your tailbone should point down to the ground, not behind your body, resulting in an over-extended swayback posture.   INCORRECT STANDING POSTURES  Common incorrect standing postures include a forward head, locked knees and/or an excessive swayback. WALKING Walk with an upright posture. Your ears, shoulders and hips should all line-up.  PROLONGED ACTIVITY IN A FLEXED POSITION When completing a task that requires you to bend forward at your waist or lean over a low surface, try to find a way to stabilize 3 out of 4 of your limbs. You can place a hand or elbow on your thigh or rest a knee on the surface you are reaching across. This will provide you more stability, so that your muscles do not tire as quickly. By keeping your knees relaxed, or slightly bent, you will also reduce stress across your lower back. CORRECT LIFTING TECHNIQUES  DO :  Assume a wide stance. This will provide you more stability and the opportunity to get as close as possible to the object which you are lifting.  Tense your abdominals to brace your spine. Bend at the knees and hips. Keeping your back locked in a neutral-spine position, lift using your leg muscles. Lift with your legs, keeping your back straight.  Test the weight of unknown objects before attempting to lift them.  Try to keep your elbows locked down at your sides in order get the best strength from your shoulders when carrying an object.     Always ask for help when lifting heavy or awkward objects. INCORRECT LIFTING TECHNIQUES DO NOT:   Lock your knees when lifting, even if it is a small  object.  Bend and twist. Pivot at your feet or move your feet when needing to change directions.  Assume that you can safely pick up even a paperclip without proper posture.

## 2017-10-25 NOTE — Progress Notes (Signed)
Chief Complaint  Patient presents with  . Annual Exam    Well Male Travis Fields is here for a complete physical.   His last physical was >1 year ago.  Current diet: in general, an unhealthy diet Current exercise: little Weight trend: gaining Seat belt? Yes.    Health maintenance Shingrix- No Colonoscopy- No - Getting scheduled Tetanus- No HIV- Yes - 10/26/2002 Prostate cancer screening- No   Past Medical History:  Diagnosis Date  . Asthma   . GERD (gastroesophageal reflux disease)   . Gout   . History of chicken pox   . History of kidney stones   . Morbid obesity ([place]) 12/05/2015  . OSA on CPAP 12/05/2015  . Seasonal allergies       Past Surgical History:  Procedure Laterality Date  . INNER EAR SURGERY      Medications  Current Outpatient Medications on File Prior to Visit  Medication Sig Dispense Refill  . cetirizine (ZYRTEC) 10 MG tablet Take 10 mg by mouth daily.    Marland Kitchen esomeprazole (NEXIUM) 20 MG capsule Take 40 mg by mouth daily at 12 noon.     Allergies No Known Allergies  Family History Family History  Problem Relation Age of Onset  . Cancer Neg Hx     Review of Systems: Constitutional:  no fevers Eye:  no recent significant change in vision Ear/Nose/Mouth/Throat:  Ears:  no hearing loss Nose/Mouth/Throat:  no complaints of nasal congestion, no sore throat Cardiovascular:  no chest pain, no palpitations Respiratory:  no cough and no shortness of breath Gastrointestinal:  no abdominal pain, no change in bowel habits GU:  Male: negative for dysuria, frequency, and incontinence and negative for prostate symptoms Musculoskeletal/Extremities: +R heel pain; otherwise no pain, redness, or swelling of the joints Integumentary (Skin/Breast): +itching in between butt cheeks; otherwise no abnormal skin lesions reported Neurologic:  no headaches Endocrine: No unexpected weight changes Hematologic/Lymphatic:  no abnormal bleeding  Exam BP 132/82 (BP  Location: Left Arm, Patient Position: Sitting, Cuff Size: Large)   Pulse 72   Temp 98.5 F (36.9 C) (Oral)   Ht 5\' 9"  (1.753 m)   Wt 298 lb (135.2 kg)   SpO2 97%   BMI 44.01 kg/m  General:  well developed, well nourished, in no apparent distress Skin: mild clearing of skin in intergluteal cleft, no excoriation, drainage or fluctuance; otherwise no significant moles, warts, or growths Head:  no masses, lesions, or tenderness Eyes:  pupils equal and round, sclera anicteric without injection Ears:  canals without lesions, TMs shiny without retraction, no obvious effusion, no erythema Nose:  nares patent, septum midline, mucosa normal Throat/Pharynx:  lips and gingiva without lesion; tongue and uvula midline; non-inflamed pharynx; no exudates or postnasal drainage Neck: neck supple without adenopathy, thyromegaly, or masses Lungs:  clear to auscultation, breath sounds equal bilaterally, no respiratory distress Cardio:  regular rate and rhythm, no LE edema, no bruits Abdomen:  abdomen soft, nontender; bowel sounds normal; no masses or organomegaly Genital (male): circumcised penis, no lesions or discharge; testes present bilaterally without masses or tenderness Rectal: Deferred Musculoskeletal:  symmetrical muscle groups noted without atrophy or deformity Extremities:  no clubbing, cyanosis, or edema, no deformities, no skin discoloration Neuro:  gait normal; deep tendon reflexes normal and symmetric Psych: well oriented with normal range of affect and appropriate judgment/insight  Assessment and Plan  Well adult exam - Plan: Lipid panel, Comprehensive metabolic panel, Hemoglobin A1c  Immunization due - Plan: Tdap vaccine greater than  or equal to 61yo IM   Well 52 y.o. male. Counseled on diet and exercise. Counseled on risks and benefits of prostate cancer screening with PSA. The patient agrees to forego testing. Lotrisone for skin.  Plantar fasciitis- stretches/exercises, ice, nsaids.   Immunizations, labs, and further orders as above. Follow up in 6 mo. The patient voiced understanding and agreement to the plan.  [place], DO 10/25/17 3:08 PM

## 2017-10-25 NOTE — Progress Notes (Signed)
Pre visit review using our clinic review tool, if applicable. No additional management support is needed unless otherwise documented below in the visit note. 

## 2018-04-27 ENCOUNTER — Ambulatory Visit: Payer: PRIVATE HEALTH INSURANCE | Admitting: Family Medicine

## 2019-01-02 ENCOUNTER — Other Ambulatory Visit: Payer: Self-pay

## 2019-01-03 ENCOUNTER — Other Ambulatory Visit: Payer: Self-pay

## 2019-01-03 ENCOUNTER — Ambulatory Visit (INDEPENDENT_AMBULATORY_CARE_PROVIDER_SITE_OTHER): Payer: PRIVATE HEALTH INSURANCE | Admitting: Family Medicine

## 2019-01-03 ENCOUNTER — Encounter: Payer: Self-pay | Admitting: Family Medicine

## 2019-01-03 VITALS — BP 136/86 | HR 80 | Temp 97.4°F | Ht 69.0 in | Wt 295.0 lb

## 2019-01-03 DIAGNOSIS — L03113 Cellulitis of right upper limb: Secondary | ICD-10-CM

## 2019-01-03 DIAGNOSIS — Z23 Encounter for immunization: Secondary | ICD-10-CM

## 2019-01-03 MED ORDER — DOXYCYCLINE HYCLATE 100 MG PO TABS: 100.0000 mg | ORAL_TABLET | Freq: Two times a day (BID) | ORAL | 0 refills | Status: AC

## 2019-01-03 NOTE — Patient Instructions (Addendum)
Stop the current antibiotic.  Cancel the appointment if you are doing better.  Stay hydrated.  Ice the area.  OK to take Tylenol 1000 mg (2 extra strength tabs) or 975 mg (3 regular strength tabs) every 6 hours as needed.  Ibuprofen 400-600 mg (2-3 over the counter strength tabs) every 6 hours as needed for pain.  Things to look out for: increasing pain not relieved by ibuprofen/acetaminophen, fevers, spreading redness, drainage of pus, or foul odor coming from area.  Let us know if you need anything.

## 2019-01-03 NOTE — Progress Notes (Signed)
Chief Complaint  Patient presents with  . Wrist Pain    right wrist pain and swelling    Travis Fields is a 53 y.o. male here for a skin complaint.  Duration: 2 days Location: R dorsal wrist Pruritic? No Painful? Yes Drainage? No New soaps/lotions/topicals/detergents? No No injury Other associated symptoms: swelling, decreased ROM Therapies tried thus far: is on Augmentin for URI  ROS:  Const: No fevers Skin: As noted in HPI  Past Medical History:  Diagnosis Date  . Asthma   . GERD (gastroesophageal reflux disease)   . Gout   . History of chicken pox   . History of kidney stones   . Morbid obesity ([place]) 12/05/2015  . OSA on CPAP 12/05/2015  . Seasonal allergies     BP 136/86 (BP Location: Left Arm, Patient Position: Sitting, Cuff Size: Large)   Pulse 80   Temp (!) 97.4 F (36.3 C) (Temporal)   Ht 5\' 9"  (1.753 m)   Wt 295 lb (133.8 kg)   SpO2 98%   BMI 43.56 kg/m  Gen: awake, alert, appearing stated age Lungs: No accessory muscle use Skin: +warmth and soft tissue edema, +TTP. No drainage, fluctuance, excoriation Psych: Age appropriate judgment and insight     Cellulitis of right upper extremity - Plan: doxycycline (VIBRA-TABS) 100 MG tablet  Need for influenza vaccination - Plan: Flu Vaccine QUAD 6+ mos PF IM (Fluarix Quad PF)  Change Augmentin to doxycycline.  Ice, Tylenol, ibuprofen.  Warning signs/symptoms verbalized and written down. F/u in 1 week to recheck, cancel if doing better. The patient voiced understanding and agreement to the plan.  Grainger, DO 01/03/19 11:44 AM

## 2019-01-05 ENCOUNTER — Encounter: Payer: Self-pay | Admitting: Family Medicine

## 2019-01-10 ENCOUNTER — Ambulatory Visit: Payer: PRIVATE HEALTH INSURANCE | Admitting: Family Medicine

## 2019-02-24 ENCOUNTER — Encounter: Payer: Self-pay | Admitting: Gastroenterology

## 2019-03-06 ENCOUNTER — Encounter: Payer: Self-pay | Admitting: Gastroenterology

## 2019-03-06 ENCOUNTER — Ambulatory Visit (AMBULATORY_SURGERY_CENTER): Payer: PRIVATE HEALTH INSURANCE | Admitting: *Deleted

## 2019-03-06 ENCOUNTER — Other Ambulatory Visit: Payer: Self-pay

## 2019-03-06 VITALS — Temp 96.2°F | Ht 69.0 in | Wt 298.0 lb

## 2019-03-06 DIAGNOSIS — Z1211 Encounter for screening for malignant neoplasm of colon: Secondary | ICD-10-CM

## 2019-03-06 DIAGNOSIS — Z1159 Encounter for screening for other viral diseases: Secondary | ICD-10-CM

## 2019-03-06 MED ORDER — SUPREP BOWEL PREP KIT 17.5-3.13-1.6 GM/177ML PO SOLN: 1.0000 | Freq: Once | ORAL | 0 refills | Status: AC

## 2019-03-06 NOTE — Progress Notes (Signed)
No egg or soy allergy known to patient  No issues with past sedation with any surgeries  or procedures, no intubation problems  No diet pills per patient No home 02 use per patient  No blood thinners per patient  Pt denies issues with constipation  No A fib or A flutter  EMMI video sent to pt's e mail   Due to the COVID-19 pandemic we are asking patients to follow these guidelines. Please only bring one care partner. Please be aware that your care partner may wait in the car in the parking lot or if they feel like they will be too hot to wait in the car, they may wait in the lobby on the 4th floor. All care partners are required to wear a mask the entire time (we do not have any that we can provide them), they need to practice social distancing, and we will do a Covid check for all patient's and care partners when you arrive. Also we will check their temperature and your temperature. If the care partner waits in their car they need to stay in the parking lot the entire time and we will call them on their cell phone when the patient is ready for discharge so they can bring the car to the front of the building. Also all patient's will need to wear a mask into building. Suprep $15 coupon  

## 2019-03-20 ENCOUNTER — Other Ambulatory Visit: Payer: Self-pay | Admitting: Gastroenterology

## 2019-03-20 ENCOUNTER — Ambulatory Visit (INDEPENDENT_AMBULATORY_CARE_PROVIDER_SITE_OTHER): Payer: PRIVATE HEALTH INSURANCE

## 2019-03-20 ENCOUNTER — Encounter: Payer: PRIVATE HEALTH INSURANCE | Admitting: Gastroenterology

## 2019-03-20 DIAGNOSIS — Z1159 Encounter for screening for other viral diseases: Secondary | ICD-10-CM

## 2019-03-20 LAB — SARS CORONAVIRUS 2 (TAT 6-24 HRS): SARS Coronavirus 2: NEGATIVE

## 2019-03-22 ENCOUNTER — Encounter: Payer: Self-pay | Admitting: Gastroenterology

## 2019-03-22 ENCOUNTER — Other Ambulatory Visit: Payer: Self-pay

## 2019-03-22 ENCOUNTER — Ambulatory Visit (AMBULATORY_SURGERY_CENTER): Payer: PRIVATE HEALTH INSURANCE | Admitting: Gastroenterology

## 2019-03-22 VITALS — BP 159/88 | HR 78 | Temp 99.0°F | Resp 14 | Ht 69.0 in | Wt 298.0 lb

## 2019-03-22 DIAGNOSIS — D120 Benign neoplasm of cecum: Secondary | ICD-10-CM | POA: Diagnosis not present

## 2019-03-22 DIAGNOSIS — K621 Rectal polyp: Secondary | ICD-10-CM | POA: Diagnosis not present

## 2019-03-22 DIAGNOSIS — D128 Benign neoplasm of rectum: Secondary | ICD-10-CM

## 2019-03-22 DIAGNOSIS — Z1211 Encounter for screening for malignant neoplasm of colon: Secondary | ICD-10-CM

## 2019-03-22 MED ORDER — SODIUM CHLORIDE 0.9 % IV SOLN: 500.0000 mL | Freq: Once | INTRAVENOUS | Status: DC

## 2019-03-22 NOTE — Progress Notes (Signed)
Pt's states no medical or surgical changes since previsit or office visit.   TempJR Vitals KA

## 2019-03-22 NOTE — Progress Notes (Signed)
Called to room to assist during endoscopic procedure.  Patient ID and intended procedure confirmed with present staff. Received instructions for my participation in the procedure from the performing physician.  

## 2019-03-22 NOTE — Op Note (Signed)
[facility] Patient Name: Travis Fields Procedure Date: 03/22/2019 10:52 AM MRN: [id number] Endoscopist: Jackquline Denmark , MD Age: 54 Referring MD:  Date of Birth: 01-Jun-1965 Gender: Male Account #: [id number] Procedure:                Colonoscopy Indications:              Screening for colorectal malignant neoplasm Medicines:                Monitored Anesthesia Care Procedure:                Pre-Anesthesia Assessment:                           - Prior to the procedure, a History and Physical                            was performed, and patient medications and                            allergies were reviewed. The patient's tolerance of                            previous anesthesia was also reviewed. The risks                            and benefits of the procedure and the sedation                            options and risks were discussed with the patient.                            All questions were answered, and informed consent                            was obtained. Prior Anticoagulants: The patient has                            taken no previous anticoagulant or antiplatelet                            agents. ASA Grade Assessment: II - A patient with                            mild systemic disease. After reviewing the risks                            and benefits, the patient was deemed in                            satisfactory condition to undergo the procedure.                           After obtaining informed consent, the colonoscope  was passed under direct vision. Throughout the                            procedure, the patient's blood pressure, pulse, and                            oxygen saturations were monitored continuously. The                            Colonoscope was introduced through the anus and                            advanced to the 2 cm into the ileum. The                            colonoscopy was performed  without difficulty. The                            patient tolerated the procedure well. The quality                            of the bowel preparation was good. The terminal                            ileum, ileocecal valve, appendiceal orifice, and                            rectum were photographed. Scope In: 10:56:16 AM Scope Out: [id number] AM Scope Withdrawal Time: 0 hours 8 minutes 30 seconds  Total Procedure Duration: 0 hours 10 minutes 46 seconds  Findings:                 Two sessile polyps were found in the rectum and                            cecum. The polyps were 2 to 4 mm in size. These                            polyps were removed with a cold biopsy forceps.                            Resection and retrieval were complete. Estimated                            blood loss: none.                           Non-bleeding internal hemorrhoids were found during                            retroflexion. The hemorrhoids were small.                           The terminal ileum appeared normal.  The exam was otherwise without abnormality on                            direct and retroflexion views. Complications:            No immediate complications. Estimated Blood Loss:     Estimated blood loss: none. Impression:               -Small colonic polyps s/p polypectomy.                           -Small internal hemorrhoids.                           -Otherwise normal colonoscopy to TI. Recommendation:           - Patient has a contact number available for                            emergencies. The signs and symptoms of potential                            delayed complications were discussed with the                            patient. Return to normal activities tomorrow.                            Written discharge instructions were provided to the                            patient.                           - Resume previous diet.                            - Continue present medications.                           - Await pathology results.                           - Repeat colonoscopy for surveillance based on                            pathology results.                           - Return to GI clinic PRN.                           - The findings and recommendations were discussed                            with the patient's wife Travis Fields. Jackquline Denmark, MD 03/22/2019 11:14:29 AM This report has been signed electronically.

## 2019-03-22 NOTE — Patient Instructions (Signed)
YOU HAD AN ENDOSCOPIC PROCEDURE TODAY AT [facility]:   Refer to the procedure report that was given to you for any specific questions about what was found during the examination.  If the procedure report does not answer your questions, please call your gastroenterologist to clarify.  If you requested that your care partner not be given the details of your procedure findings, then the procedure report has been included in a sealed envelope for you to review at your convenience later.  **Handouts given on polyps and hemorrhoids**  YOU SHOULD EXPECT: Some feelings of bloating in the abdomen. Passage of more gas than usual.  Walking can help get rid of the air that was put into your GI tract during the procedure and reduce the bloating. If you had a lower endoscopy (such as a colonoscopy or flexible sigmoidoscopy) you may notice spotting of blood in your stool or on the toilet paper. If you underwent a bowel prep for your procedure, you may not have a normal bowel movement for a few days.  Please Note:  You might notice some irritation and congestion in your nose or some drainage.  This is from the oxygen used during your procedure.  There is no need for concern and it should clear up in a day or so.  SYMPTOMS TO REPORT IMMEDIATELY:   Following lower endoscopy (colonoscopy or flexible sigmoidoscopy):  Excessive amounts of blood in the stool  Significant tenderness or worsening of abdominal pains  Swelling of the abdomen that is new, acute  Fever of 100F or higher   For urgent or emergent issues, a gastroenterologist can be reached at any hour by calling [phone].   DIET:  We do recommend a small meal at first, but then you may proceed to your regular diet.  Drink plenty of fluids but you should avoid alcoholic beverages for 24 hours.  ACTIVITY:  You should plan to take it easy for the rest of today and you should NOT DRIVE or use heavy machinery until tomorrow (because of  the sedation medicines used during the test).    FOLLOW UP: Our staff will call the number listed on your records 48-72 hours following your procedure to check on you and address any questions or concerns that you may have regarding the information given to you following your procedure. If we do not reach you, we will leave a message.  We will attempt to reach you two times.  During this call, we will ask if you have developed any symptoms of COVID 19. If you develop any symptoms (ie: fever, flu-like symptoms, shortness of breath, cough etc.) before then, please call [phone].  If you test positive for Covid 19 in the 2 weeks post procedure, please call and report this information to [place].    If any biopsies were taken you will be contacted by phone or by letter within the next 1-3 weeks.  Please call us at [phone] if you have not heard about the biopsies in 3 weeks.    SIGNATURES/CONFIDENTIALITY: You and/or your care partner have signed paperwork which will be entered into your electronic medical record.  These signatures attest to the fact that that the information above on your After Visit Summary has been reviewed and is understood.  Full responsibility of the confidentiality of this discharge information lies with you and/or your care-partner.

## 2019-03-22 NOTE — Progress Notes (Signed)
Report to PACU, RN, vss, BBS= Clear.  

## 2019-03-24 ENCOUNTER — Telehealth: Payer: Self-pay

## 2019-03-24 ENCOUNTER — Telehealth: Payer: Self-pay | Admitting: *Deleted

## 2019-03-24 NOTE — Telephone Encounter (Signed)
LVM

## 2019-03-24 NOTE — Telephone Encounter (Signed)
  Follow up Call-  Call back number 03/22/2019  Post procedure Call Back phone  # [phone]  Permission to leave phone message Yes  Some recent data might be hidden     Patient questions:  Do you have a fever, pain , or abdominal swelling? No. Pain Score  0 *  Have you tolerated food without any problems? Yes.    Have you been able to return to your normal activities? Yes.    Do you have any questions about your discharge instructions: Diet   No. Medications  No. Follow up visit  No.  Do you have questions or concerns about your Care? No.  Actions: * If pain score is 4 or above: No action needed, pain <4.  1. Have you developed a fever since your procedure? no  2.   Have you had an respiratory symptoms (SOB or cough) since your procedure? no  3.   Have you tested positive for COVID 19 since your procedure no  4.   Have you had any family members/close contacts diagnosed with the COVID 19 since your procedure?  no   If yes to any of these questions please route to Joylene John, RN and Alphonsa Gin, Therapist, sports.

## 2019-03-28 ENCOUNTER — Encounter: Payer: Self-pay | Admitting: Gastroenterology

## 2019-07-12 ENCOUNTER — Emergency Department (HOSPITAL_BASED_OUTPATIENT_CLINIC_OR_DEPARTMENT_OTHER)
Admission: EM | Admit: 2019-07-12 | Discharge: 2019-07-12 | Disposition: A | Discharge status: Home / Self Care | Payer: 59 | Attending: Emergency Medicine | Admitting: Emergency Medicine

## 2019-07-12 ENCOUNTER — Emergency Department (HOSPITAL_BASED_OUTPATIENT_CLINIC_OR_DEPARTMENT_OTHER): Payer: 59

## 2019-07-12 ENCOUNTER — Ambulatory Visit (INDEPENDENT_AMBULATORY_CARE_PROVIDER_SITE_OTHER)
Admission: RE | Admit: 2019-07-12 | Discharge: 2019-07-12 | Disposition: A | Discharge status: Other Acute Inpatient Hospital | Payer: PRIVATE HEALTH INSURANCE | Source: Ambulatory Visit

## 2019-07-12 ENCOUNTER — Encounter (HOSPITAL_BASED_OUTPATIENT_CLINIC_OR_DEPARTMENT_OTHER): Payer: Self-pay

## 2019-07-12 ENCOUNTER — Other Ambulatory Visit: Payer: Self-pay

## 2019-07-12 DIAGNOSIS — L03116 Cellulitis of left lower limb: Secondary | ICD-10-CM | POA: Insufficient documentation

## 2019-07-12 DIAGNOSIS — M7989 Other specified soft tissue disorders: Secondary | ICD-10-CM

## 2019-07-12 DIAGNOSIS — I10 Essential (primary) hypertension: Secondary | ICD-10-CM | POA: Diagnosis not present

## 2019-07-12 DIAGNOSIS — Z79899 Other long term (current) drug therapy: Secondary | ICD-10-CM | POA: Insufficient documentation

## 2019-07-12 MED ORDER — CEPHALEXIN 500 MG PO CAPS: 500.0000 mg | ORAL_CAPSULE | Freq: Three times a day (TID) | ORAL | 0 refills | Status: DC

## 2019-07-12 MED ORDER — CEPHALEXIN 250 MG PO CAPS
500.0000 mg | ORAL_CAPSULE | Freq: Once | ORAL | Status: DC
  Filled 2019-07-12: qty 2

## 2019-07-12 NOTE — ED Triage Notes (Signed)
Pt c/o swelling to left ankle/foot-sx started 4/26 after long car ride-pt states he also had chills and felt hot to the touch with no fever-states he was sent from UC to r/o "blood clot"-NAD-steady gait

## 2019-07-12 NOTE — Discharge Instructions (Signed)
Your DVT scan was negative for blood clot in your calf. I am prescribing an antibiotic to cover for infection.  Please take Keflex 500 mg every 6 hours for the next 5 days.  Please follow-up with your primary care doctor for reevaluation.  If you have any new or concerning symptoms any return to ED for reevaluation.

## 2019-07-12 NOTE — ED Notes (Signed)
ED Provider at bedside. 

## 2019-07-12 NOTE — Discharge Instructions (Addendum)
Go to the Urgent Care or Emergency Department for an in-person evaluation of the pain and swelling in your foot/ankle.

## 2019-07-12 NOTE — ED Provider Notes (Signed)
Virtual Visit via Video Note:  Travis Fields  initiated request for Telemedicine visit with [facility] Urgent Care team. I connected with Candice Camp  on 07/12/2019 at 11:27 AM  for a synchronized telemedicine visit using a video enabled HIPPA compliant telemedicine application. I verified that I am speaking with Candice Camp  using two identifiers. Sharion Balloon, NP  was physically located in a [facility] Urgent care site and [facility] was located at a different location.   The limitations of evaluation and management by telemedicine as well as the availability of in-person appointments were discussed. Patient was informed that he  may incur a bill ( including co-pay) for this virtual visit encounter. Candice Camp  expressed understanding and gave verbal consent to proceed with virtual visit.     History of Present Illness:Travis Fields  is a 54 y.o. male presents for evaluation of 2 day history of left foot and ankle pain and swelling.   No known injury but he had a 9.5 hour drive in the car on [id number].  He is concerned about the possibility of a blood clot.  No shortness of breath.     No Known Allergies   Past Medical History:  Diagnosis Date  . Allergy   . Asthma   . Chronic kidney disease    kidney stones   . GERD (gastroesophageal reflux disease)    dx'd with GERD 10- 12 yrs ago with EGD   . Gout   . History of chicken pox   . History of kidney stones   . History of shingles   . Hypertension   . Morbid obesity ([place]) 12/05/2015  . OSA on CPAP 12/05/2015  . Seasonal allergies   . Sleep apnea    wears cpap      Social History   Tobacco Use  . Smoking status: Never Smoker  . Smokeless tobacco: Never Used  Substance Use Topics  . Alcohol use: No  . Drug use: No        Observations/Objective: Physical Exam  VITALS: Patient denies fever. GENERAL: Alert, appears well and in no acute distress. HEENT: Atraumatic. NECK: Normal movements of the head  and neck. CARDIOPULMONARY: No increased WOB. Speaking in clear sentences. I:E ratio WNL.  MS: Moves all visible extremities without noticeable abnormality. PSYCH: Pleasant and cooperative, well-groomed. Speech normal rate and rhythm. Affect is appropriate. Insight and judgement are appropriate. Attention is focused, linear, and appropriate.  NEURO: CN grossly intact. Oriented as arrived to appointment on time with no prompting. Moves both UE equally.  SKIN: No obvious lesions, wounds, erythema, or cyanosis noted on face or hands.   Assessment and Plan:    ICD-10-CM   1. Swelling of left foot  M79.89        Follow Up Instructions: Discussed with patient that his symptoms cannot be adequately evaluated on a video visit and that he needs to be seen in person either at the urgent care or emergency department.  Patient agrees to go for in-person evaluation.      I discussed the assessment and treatment plan with the patient. The patient was provided an opportunity to ask questions and all were answered. The patient agreed with the plan and demonstrated an understanding of the instructions.   The patient was advised to call back or seek an in-person evaluation if the symptoms worsen or if the condition fails to improve as anticipated.      Sharion Balloon, NP  07/12/2019 11:27  AM         Sharion Balloon, NP 07/12/19 [phone]

## 2019-07-12 NOTE — ED Provider Notes (Signed)
[facility] EMERGENCY DEPARTMENT Provider Note   CSN: [id number] Arrival date & time: 07/12/19  1524     History Chief Complaint  Patient presents with  . Leg Swelling    Travis Fields is a 54 y.o. male.  HPI  Patient is a 54 year old male with a history of left leg cellulitis, gout, hypertension, morbid obesity, OSA on CPAP.  Patient is presented today for left ankle, foot, cast swelling started 4/26 after a very long, all day car drive.  He states he also had some chills and felt that his skin was warm to touch but denies any fevers, headache, dizziness, fatigue, malaise, nausea, vomiting.  He states he went to an urgent care but was sent to the emergency department to rule out a "blood clot".  He states he is otherwise feeling well at this time.  He denies any trauma to the lower extremity.  He does endorse some difficulty with walking secondary to discomfort but rates the pain as 1/10 and states that it is constant, achy, worse with movement.     Past Medical History:  Diagnosis Date  . Allergy   . Asthma   . Chronic kidney disease    kidney stones   . GERD (gastroesophageal reflux disease)    dx'd with GERD 10- 12 yrs ago with EGD   . Gout   . History of chicken pox   . History of kidney stones   . History of shingles   . Hypertension   . Morbid obesity ([place]) 12/05/2015  . OSA on CPAP 12/05/2015  . Seasonal allergies   . Sleep apnea    wears cpap     Patient Active Problem List   Diagnosis Date Noted  . Right foot pain 06/07/2017  . Essential hypertension 03/29/2017  . Morbid obesity ([place]) 12/05/2015  . GERD (gastroesophageal reflux disease) 12/05/2015  . OSA on CPAP 12/05/2015    Past Surgical History:  Procedure Laterality Date  . INNER EAR SURGERY    . UPPER GASTROINTESTINAL ENDOSCOPY         Family History  Adopted: Yes  Problem Relation Age of Onset  . Cancer Neg Hx     Social History   Tobacco Use  . Smoking status: Never  Smoker  . Smokeless tobacco: Never Used  Substance Use Topics  . Alcohol use: No  . Drug use: No    Home Medications Prior to Admission medications   Medication Sig Start Date End Date Taking? Authorizing Provider  cetirizine (ZYRTEC) 10 MG tablet Take 10 mg by mouth daily.   Yes [provider]  esomeprazole (NEXIUM) 20 MG capsule Take 20 mg by mouth daily at 12 noon.   Yes [provider]  Multiple Vitamins-Minerals (MENS MULTIVITAMIN) TABS Take 1 capsule by mouth daily.   Yes [provider]  cephALEXin (KEFLEX) 500 MG capsule Take 1 capsule (500 mg total) by mouth 3 (three) times daily. 07/12/19   Tedd Sias, PA    Allergies    Patient has no known allergies.  Review of Systems   Review of Systems  Constitutional: Negative for chills and fever.  HENT: Negative for congestion.   Respiratory: Negative for shortness of breath.   Cardiovascular: Negative for chest pain.  Gastrointestinal: Negative for abdominal pain.  Musculoskeletal: Negative for neck pain.       Left lower leg swelling, warmth, achiness    Physical Exam Updated Vital Signs BP (!) 144/82 (BP Location: Right Arm)  Pulse 78   Temp 98.4 F (36.9 C) (Oral)   Resp 20   Ht 5\' 9"  (1.753 m)   Wt (!) 140.2 kg   SpO2 97%   BMI 45.63 kg/m   Physical Exam Vitals and nursing note reviewed.  Constitutional:      General: He is not in acute distress. HENT:     Head: Normocephalic and atraumatic.     Nose: Nose normal.  Eyes:     General: No scleral icterus. Cardiovascular:     Rate and Rhythm: Normal rate and regular rhythm.     Pulses: Normal pulses.     Heart sounds: Normal heart sounds.  Pulmonary:     Effort: Pulmonary effort is normal. No respiratory distress.     Breath sounds: No wheezing.  Abdominal:     Palpations: Abdomen is soft.     Comments: Soft, nontender protuberant abdomen  Musculoskeletal:     Cervical back: Normal range of motion.     Right lower  leg: No edema.     Left lower leg: No edema.     Comments: Bilateral trace lower extremity edema.  Left lower extremity edema is marginally worse than right lower extremity.  There is superficial redness to the left shin which is warm to touch.  No significant induration or fluctuance.   Skin:    General: Skin is warm and dry.     Capillary Refill: Capillary refill takes less than 2 seconds.  Neurological:     Mental Status: He is alert. Mental status is at baseline.  Psychiatric:        Mood and Affect: Mood normal.        Behavior: Behavior normal.     ED Results / Procedures / Treatments   Labs (all labs ordered are listed, but only abnormal results are displayed) Labs Reviewed - No data to display  EKG None  Radiology US Venous Img Lower  Left (DVT Study)  Result Date: 07/12/2019 CLINICAL DATA:  Left leg pain for 2 days. EXAM: LEFT LOWER EXTREMITY VENOUS DOPPLER ULTRASOUND TECHNIQUE: Gray-scale sonography with graded compression, as well as color Doppler and duplex ultrasound were performed to evaluate the lower extremity deep venous systems from the level of the common femoral vein and including the common femoral, femoral, profunda femoral, popliteal and calf veins including the posterior tibial, peroneal and gastrocnemius veins when visible. The superficial great saphenous vein was also interrogated. Spectral Doppler was utilized to evaluate flow at rest and with distal augmentation maneuvers in the common femoral, femoral and popliteal veins. COMPARISON:  None. FINDINGS: Contralateral Common Femoral Vein: Respiratory phasicity is normal and symmetric with the symptomatic side. No evidence of thrombus. Normal compressibility. Common Femoral Vein: No evidence of thrombus. Normal compressibility, respiratory phasicity and response to augmentation. Saphenofemoral Junction: No evidence of thrombus. Normal compressibility and flow on color Doppler imaging. Profunda Femoral Vein: No  evidence of thrombus. Normal compressibility and flow on color Doppler imaging. Femoral Vein: No evidence of thrombus. Normal compressibility, respiratory phasicity and response to augmentation. Popliteal Vein: No evidence of thrombus. Normal compressibility, respiratory phasicity and response to augmentation. Calf Veins: No evidence of thrombus. Normal compressibility and flow on color Doppler imaging. Superficial Great Saphenous Vein: No evidence of thrombus. Normal compressibility. Venous Reflux:  None. Other Findings:  None. IMPRESSION: No evidence of left lower extremity deep venous thrombosis. Electronically Signed   By: Keith Rake M.D.   On: 07/12/2019 17:08    Procedures Procedures (including  critical care time)  Medications Ordered in ED Medications - No data to display  ED Course  I have reviewed the triage vital signs and the nursing notes.  Pertinent labs & imaging results that were available during my care of the patient were reviewed by me and considered in my medical decision making (see chart for details).    MDM Rules/Calculators/A&P                      Patient is 54 year old male with past medical history significant for cellulitis of left lower extremity presented today with left lower extremity pain that is mild, achy, warm and mildly red.  He has had no trauma.  However he has a history concerning for DVT since he has symptoms onset after a long car drive.  He has a history of DVT in the past.  Physical exam is notable for warm skin of the left lower extremity.  Area is concerning for cellulitis.  Will obtain Doppler ultrasound to rule out DVT and treat.  DVT scan reviewed by myself.  There is no evidence of clot.  Patient states that his pain is very mild this time.  Is declining any pain medication.  Will discharge patient with Keflex and recommend follow-up with PCP.  May benefit from some form with diuretic to reduce the mild lower extremity edema that he has  bilaterally which is likely predisposing him to cellulitis.  No crepitus of skin, no bony tenderness to indicate fracture.  He is not diabetic and is not tachycardic or hypotensive doubt sepsis/systemic infection.   Final Clinical Impression(s) / ED Diagnoses Final diagnoses:  Left leg cellulitis    Rx / DC Orders ED Discharge Orders         Ordered    cephALEXin (KEFLEX) 500 MG capsule  3 times daily     07/12/19 1748           Pati Gallo [place], [place] 07/13/19 [id number]    Fredia Sorrow, MD 07/15/19 0930

## 2019-07-19 ENCOUNTER — Ambulatory Visit (INDEPENDENT_AMBULATORY_CARE_PROVIDER_SITE_OTHER): Payer: 59 | Admitting: Family Medicine

## 2019-07-19 ENCOUNTER — Other Ambulatory Visit: Payer: Self-pay

## 2019-07-19 ENCOUNTER — Encounter: Payer: Self-pay | Admitting: Family Medicine

## 2019-07-19 VITALS — BP 148/98 | HR 78 | Temp 95.1°F | Ht 69.0 in | Wt 302.0 lb

## 2019-07-19 DIAGNOSIS — R600 Localized edema: Secondary | ICD-10-CM | POA: Diagnosis not present

## 2019-07-19 DIAGNOSIS — R030 Elevated blood-pressure reading, without diagnosis of hypertension: Secondary | ICD-10-CM | POA: Diagnosis not present

## 2019-07-19 NOTE — Progress Notes (Signed)
Chief Complaint  Patient presents with  . Follow-up    ED    Subjective: Patient is a 54 y.o. male here for ED f/u.  Patient was diagnosed with cellulitis several days ago in the emergency department.  He is finishing his course of Keflex and his cellulitis is resolving.  He was told that his lower extremity edema may be contributing.  His diet has not been very good lately.  He has been walking more as he plans to go to [place] for a couple weeks coming up.  He has no known history of hepatic, renal, or cardiac failure.  He does not wear compression stockings and does not elevate his legs routinely.  He does sit a lot for his job he is currently working at home.  He has not been checking his blood pressure at home.  Past Medical History:  Diagnosis Date  . Allergy   . Asthma   . Chronic kidney disease    kidney stones   . GERD (gastroesophageal reflux disease)    dx'd with GERD 10- 12 yrs ago with EGD   . Gout   . History of chicken pox   . History of kidney stones   . History of shingles   . Hypertension   . Morbid obesity ([place]) 12/05/2015  . OSA on CPAP 12/05/2015  . Seasonal allergies   . Sleep apnea    wears cpap     Objective: BP (!) 148/98 (BP Location: Left Arm, Patient Position: Sitting, Cuff Size: Large)   Pulse 78   Temp (!) 95.1 F (35.1 C) (Temporal)   Ht 5\' 9"  (1.753 m)   Wt (!) 302 lb (137 kg)   SpO2 93%   BMI 44.60 kg/m  General: Awake, appears stated age HEENT: MMM, EOMi Heart: RRR, no bruits, 2+ pitting edema b/l tapering at knees Lungs: CTAB, no rales, wheezes or rhonchi. No accessory muscle use Skin: Left lower extremity-no erythema or hyperpigmentation.  There is no excessive warmth or scaling.  No fluctuance. Psych: Age appropriate judgment and insight, normal affect and mood   Assessment and Plan: Bilateral lower extremity edema  Elevated blood pressure reading  Prescription for compression stockings provided for patient today.  Elevate legs,  mind salt intake, counseled on exercise.  He used to be on blood pressure medicine but has lost weight and had come off of it.  He has gained the weight back and his blood pressure is elevated again.  He has not been checking at home which I recommend he start doing.  He goes to [place] next week.  Once he returns, he will focus on lowering his blood pressure through diet and exercise.  I will see him in 2 months to recheck that.  With this type of edema, he would benefit from an ACE/ARB, though weight loss would be the best.  He was on amlodipine in the past.  His cellulitis has resolved. The patient voiced understanding and agreement to the plan.  [place], DO 07/19/19  12:02 PM

## 2019-07-19 NOTE — Patient Instructions (Signed)
Keep the diet clean and stay active.  Ice/cold pack over area for 10-15 min twice daily.  For the swelling in your lower extremities, be sure to elevate your legs when able, mind the salt intake, stay physically active and consider wearing compression stockings.  Stretching and range of motion exercises These exercises warm up your muscles and joints and improve the movement and flexibility of your lower leg. These exercises also help to relieve pain and stiffness.  Exercise A: Gastrocnemius stretch 1. Sit with your left / right leg extended. 2. Loop a belt or towel around the ball of your left / right foot. The ball of your foot is on the walking surface, right under your toes. 3. Hold both ends of the belt or towel. 4. Keep your left / right ankle and foot relaxed and keep your knee straight while you use the belt or towel to pull your foot and ankle toward you. Stop at the first point of resistance. 5. Hold this position for 30 seconds. Repeat 2 times. Complete this exercise 3 times per week.  Exercise B: Ankle alphabet 1. Sit with your left / right leg supported at the lower leg. ? Do not rest your foot on anything. ? Make sure your foot has room to move freely. 2. Think of your left / right foot as a paintbrush, and move your foot to trace each letter of the alphabet in the air. Keep your hip and knee still while you trace. 3. Trace every letter from A to Z. Repeat 2 times. Complete this exercise 3 times per week.  Strengthening exercises These exercises build strength and endurance in your lower leg. Endurance is the ability to use your muscles for a long time, even after they get tired.  Exercise C: Plantar flexors with band 1. Sit with your left / right leg extended. 2. Loop a rubber exercise band or tube around the ball of your left / right foot. The ball of your foot is on the walking surface, right under your toes. 3. While holding both ends of the band or tube, slowly point  your toes downward, pushing them away from you. 4. Hold this position for 3 seconds. 5. Slowly return your foot to the starting position and repeat for a total of 10 repetitions. Repeat 2 times. Complete this exercise 3 times per week.  Exercise D: Plantar flexors, standing 1. Stand with your feet shoulder-width apart. 2. Place your hands on a wall or table to steady yourself as needed, but try not to use it very much for support. 3. Rise up on your toes. 4. If this exercise is too easy, try these options: ? Shift your weight toward your left / right leg until you feel challenged. ? If told by your health care provider, stand on your left / right foot only. 5. Hold this position for 3 seconds. 6. Repeat for a total of 10 repetitions. Repeat 2 times. Complete this exercise 3 times per week.  Exercise E: Plantar flexors, eccentric 1. Stand on the balls of your feet on the edge of a step. The ball of your foot is on the walking surface, right under your toes. 2. Place your hands on a wall or railing for balance as needed, but try not to lean on it for support. 3. Rise up on your toes, using both legs to help. 4. Slowly shift all of your weight to your left / right foot and lift your other foot off the  step. 5. Slowly lower your left / right heel so it drops below the level of the step. You will feel a slight stretch in your left / right calf. 6. Put your other foot back onto the step. Repeat 2 times. Complete this exercise 3 times per week. This information is not intended to replace advice given to you by your health care provider. Make sure you discuss any questions you have with your health care provider.

## 2019-09-12 ENCOUNTER — Ambulatory Visit: Payer: PRIVATE HEALTH INSURANCE | Admitting: Family Medicine

## 2019-10-27 ENCOUNTER — Encounter: Payer: Self-pay | Admitting: Family Medicine

## 2019-10-27 ENCOUNTER — Other Ambulatory Visit: Payer: Self-pay

## 2019-10-27 ENCOUNTER — Ambulatory Visit (INDEPENDENT_AMBULATORY_CARE_PROVIDER_SITE_OTHER): Payer: 59 | Admitting: Family Medicine

## 2019-10-27 VITALS — BP 144/100 | HR 77 | Temp 98.4°F | Ht 69.0 in | Wt 319.0 lb

## 2019-10-27 DIAGNOSIS — I10 Essential (primary) hypertension: Secondary | ICD-10-CM | POA: Diagnosis not present

## 2019-10-27 DIAGNOSIS — M545 Low back pain, unspecified: Secondary | ICD-10-CM

## 2019-10-27 MED ORDER — MELOXICAM 15 MG PO TABS: 15.0000 mg | ORAL_TABLET | Freq: Every day | ORAL | 0 refills | Status: DC

## 2019-10-27 MED ORDER — AMLODIPINE BESYLATE 5 MG PO TABS: 5.0000 mg | ORAL_TABLET | Freq: Every day | ORAL | 3 refills | Status: DC

## 2019-10-27 MED ORDER — CYCLOBENZAPRINE HCL 10 MG PO TABS: 5.0000 mg | ORAL_TABLET | Freq: Three times a day (TID) | ORAL | 0 refills | Status: DC | PRN

## 2019-10-27 NOTE — Progress Notes (Signed)
Musculoskeletal Exam  Patient: Travis Fields DOB: September 20, 1965  DOS: 10/27/2019  SUBJECTIVE:  Chief Complaint:   Chief Complaint  Patient presents with  . Flank Pain    right side    Travis Fields is a 54 y.o.  male for evaluation and treatment of R side/lower back pain.   Onset:  2 weeks ago. No inj or change in activity.  Location: R lower flank Character:  dull and stabbing, only when he moves a certain way; +hx of kidney stones Progression of issue:  is unchanged Associated symptoms: No urinary issues, bruising, swelling, redness Treatment: to date has been none.   Neurovascular symptoms: no  Hypertension Patient presents for hypertension follow up. He does not monitor home blood pressures. He is not currently on any medication He is not adhering to a healthy diet overall. Exercise: some walking   Past Medical History:  Diagnosis Date  . Allergy   . Asthma   . Chronic kidney disease    kidney stones   . GERD (gastroesophageal reflux disease)    dx'd with GERD 10- 12 yrs ago with EGD   . Gout   . History of chicken pox   . History of kidney stones   . History of shingles   . Hypertension   . Morbid obesity ([place]) 12/05/2015  . OSA on CPAP 12/05/2015  . Seasonal allergies   . Sleep apnea    wears cpap     Objective: VITAL SIGNS: BP (!) 144/100 (BP Location: Left Arm, Patient Position: Sitting, Cuff Size: Large)   Pulse 77   Temp 98.4 F (36.9 C) (Oral)   Ht 5\' 9"  (1.753 m)   Wt (!) 319 lb (144.7 kg)   SpO2 97%   BMI 47.11 kg/m  Constitutional: Well formed, well developed. No acute distress. Thorax & Lungs: No accessory muscle use Musculoskeletal: R lower back.   Tenderness to palpation: yes over lower thor R parasp msc Deformity: no Ecchymosis: no Tests negative: Straight leg, Lloyd's Neurologic: Normal sensory function. No focal deficits noted. DTR's equal and symmetric in LE's. No clonus. Psychiatric: Normal mood. Age appropriate judgment and  insight. Alert & oriented x 3.    Assessment:  Acute right-sided low back pain without sciatica - Plan: meloxicam (MOBIC) 15 MG tablet, cyclobenzaprine (FLEXERIL) 10 MG tablet  Essential hypertension - Plan: amLODipine (NORVASC) 5 MG tablet  Plan: 1. Discussed that I do not think this is related to his kidneys. Offered UA and CMP, declined today but will me know if he changes his mind. Stretches/exercises, heat, ice, Tylenol.  2. Counseled on diet/exercise. Needs to monitor BP at home. Restart Norvasc. F/u in 1 mo to reck. The patient voiced understanding and agreement to the plan.   [place], DO 10/27/19  9:10 AM

## 2019-10-27 NOTE — Patient Instructions (Addendum)
OK to take Tylenol 1000 mg (2 extra strength tabs) or 975 mg (3 regular strength tabs) every 6 hours as needed.  Heat (pad or rice pillow in microwave) over affected area, 10-15 minutes twice daily.   Ice/cold pack over area for 10-15 min twice daily.  Take Flexeril (cyclobenzaprine) 1-2 hours before planned bedtime. If it makes you drowsy, do not take during the day. You can try half a tab the following night.  I don't think this has any relation to your kidneys. If it would give you peace of mind, I am happy to order this. Let me know by next Wednesday.   Keep the diet clean and stay active.   Monitor your blood pressure at home.   EXERCISES  RANGE OF MOTION (ROM) AND STRETCHING EXERCISES - Low Back Pain Most people with lower back pain will find that their symptoms get worse with excessive bending forward (flexion) or arching at the lower back (extension). The exercises that will help resolve your symptoms will focus on the opposite motion.  If you have pain, numbness or tingling which travels down into your buttocks, leg or foot, the goal of the therapy is for these symptoms to move closer to your back and eventually resolve. Sometimes, these leg symptoms will get better, but your lower back pain may worsen. This is often an indication of progress in your rehabilitation. Be very alert to any changes in your symptoms and the activities in which you participated in the 24 hours prior to the change. Sharing this information with your caregiver will allow him or her to most efficiently treat your condition. These exercises may help you when beginning to rehabilitate your injury. Your symptoms may resolve with or without further involvement from your physician, physical therapist or athletic trainer. While completing these exercises, remember:   Restoring tissue flexibility helps normal motion to return to the joints. This allows healthier, less painful movement and activity.  An effective  stretch should be held for at least 30 seconds.  A stretch should never be painful. You should only feel a gentle lengthening or release in the stretched tissue. FLEXION RANGE OF MOTION AND STRETCHING EXERCISES:  STRETCH - Flexion, Single Knee to Chest   Lie on a firm bed or floor with both legs extended in front of you.  Keeping one leg in contact with the floor, bring your opposite knee to your chest. Hold your leg in place by either grabbing behind your thigh or at your knee.  Pull until you feel a gentle stretch in your low back. Hold 30 seconds.  Slowly release your grasp and repeat the exercise with the opposite side. Repeat 2 times. Complete this exercise 3 times per week.   STRETCH - Flexion, Double Knee to Chest  Lie on a firm bed or floor with both legs extended in front of you.  Keeping one leg in contact with the floor, bring your opposite knee to your chest.  Tense your stomach muscles to support your back and then lift your other knee to your chest. Hold your legs in place by either grabbing behind your thighs or at your knees.  Pull both knees toward your chest until you feel a gentle stretch in your low back. Hold 30 seconds.  Tense your stomach muscles and slowly return one leg at a time to the floor. Repeat 2 times. Complete this exercise 3 times per week.   STRETCH - Low Trunk Rotation  Lie on a firm bed  or floor. Keeping your legs in front of you, bend your knees so they are both pointed toward the ceiling and your feet are flat on the floor.  Extend your arms out to the side. This will stabilize your upper body by keeping your shoulders in contact with the floor.  Gently and slowly drop both knees together to one side until you feel a gentle stretch in your low back. Hold for 30 seconds.  Tense your stomach muscles to support your lower back as you bring your knees back to the starting position. Repeat the exercise to the other side. Repeat 2 times. Complete  this exercise at least 3 times per week.   EXTENSION RANGE OF MOTION AND FLEXIBILITY EXERCISES:  STRETCH - Extension, Prone on Elbows   Lie on your stomach on the floor, a bed will be too soft. Place your palms about shoulder width apart and at the height of your head.  Place your elbows under your shoulders. If this is too painful, stack pillows under your chest.  Allow your body to relax so that your hips drop lower and make contact more completely with the floor.  Hold this position for 30 seconds.  Slowly return to lying flat on the floor. Repeat 2 times. Complete this exercise 3 times per week.   RANGE OF MOTION - Extension, Prone Press Ups  Lie on your stomach on the floor, a bed will be too soft. Place your palms about shoulder width apart and at the height of your head.  Keeping your back as relaxed as possible, slowly straighten your elbows while keeping your hips on the floor. You may adjust the placement of your hands to maximize your comfort. As you gain motion, your hands will come more underneath your shoulders.  Hold this position 30 seconds.  Slowly return to lying flat on the floor. Repeat 2 times. Complete this exercise 3 times per week.   RANGE OF MOTION- Quadruped, Neutral Spine   Assume a hands and knees position on a firm surface. Keep your hands under your shoulders and your knees under your hips. You may place padding under your knees for comfort.  Drop your head and point your tailbone toward the ground below you. This will round out your lower back like an angry cat. Hold this position for 30 seconds.  Slowly lift your head and release your tail bone so that your back sags into a large arch, like an old horse.  Hold this position for 30 seconds.  Repeat this until you feel limber in your low back.  Now, find your "sweet spot." This will be the most comfortable position somewhere between the two previous positions. This is your neutral spine. Once you  have found this position, tense your stomach muscles to support your low back.  Hold this position for 30 seconds. Repeat 2 times. Complete this exercise 3 times per week.   STRENGTHENING EXERCISES - Low Back Sprain These exercises may help you when beginning to rehabilitate your injury. These exercises should be done near your "sweet spot." This is the neutral, low-back arch, somewhere between fully rounded and fully arched, that is your least painful position. When performed in this safe range of motion, these exercises can be used for people who have either a flexion or extension based injury. These exercises may resolve your symptoms with or without further involvement from your physician, physical therapist or athletic trainer. While completing these exercises, remember:   Muscles can gain both  the endurance and the strength needed for everyday activities through controlled exercises.  Complete these exercises as instructed by your physician, physical therapist or athletic trainer. Increase the resistance and repetitions only as guided.  You may experience muscle soreness or fatigue, but the pain or discomfort you are trying to eliminate should never worsen during these exercises. If this pain does worsen, stop and make certain you are following the directions exactly. If the pain is still present after adjustments, discontinue the exercise until you can discuss the trouble with your caregiver.  STRENGTHENING - Deep Abdominals, Pelvic Tilt   Lie on a firm bed or floor. Keeping your legs in front of you, bend your knees so they are both pointed toward the ceiling and your feet are flat on the floor.  Tense your lower abdominal muscles to press your low back into the floor. This motion will rotate your pelvis so that your tail bone is scooping upwards rather than pointing at your feet or into the floor. With a gentle tension and even breathing, hold this position for 3 seconds. Repeat 2 times.  Complete this exercise 3 times per week.   STRENGTHENING - Abdominals, Crunches   Lie on a firm bed or floor. Keeping your legs in front of you, bend your knees so they are both pointed toward the ceiling and your feet are flat on the floor. Cross your arms over your chest.  Slightly tip your chin down without bending your neck.  Tense your abdominals and slowly lift your trunk high enough to just clear your shoulder blades. Lifting higher can put excessive stress on the lower back and does not further strengthen your abdominal muscles.  Control your return to the starting position. Repeat 2 times. Complete this exercise 3 times per week.   STRENGTHENING - Quadruped, Opposite UE/LE Lift   Assume a hands and knees position on a firm surface. Keep your hands under your shoulders and your knees under your hips. You may place padding under your knees for comfort.  Find your neutral spine and gently tense your abdominal muscles so that you can maintain this position. Your shoulders and hips should form a rectangle that is parallel with the floor and is not twisted.  Keeping your trunk steady, lift your right hand no higher than your shoulder and then your left leg no higher than your hip. Make sure you are not holding your breath. Hold this position for 30 seconds.  Continuing to keep your abdominal muscles tense and your back steady, slowly return to your starting position. Repeat with the opposite arm and leg. Repeat 2 times. Complete this exercise 3 times per week.   STRENGTHENING - Abdominals and Quadriceps, Straight Leg Raise   Lie on a firm bed or floor with both legs extended in front of you.  Keeping one leg in contact with the floor, bend the other knee so that your foot can rest flat on the floor.  Find your neutral spine, and tense your abdominal muscles to maintain your spinal position throughout the exercise.  Slowly lift your straight leg off the floor about 6 inches for a  count of 3, making sure to not hold your breath.  Still keeping your neutral spine, slowly lower your leg all the way to the floor. Repeat this exercise with each leg 2 times. Complete this exercise 3 times per week.  POSTURE AND BODY MECHANICS CONSIDERATIONS - Low Back Sprain Keeping correct posture when sitting, standing or completing your  activities will reduce the stress put on different body tissues, allowing injured tissues a chance to heal and limiting painful experiences. The following are general guidelines for improved posture.  While reading these guidelines, remember:  The exercises prescribed by your provider will help you have the flexibility and strength to maintain correct postures.  The correct posture provides the best environment for your joints to work. All of your joints have less wear and tear when properly supported by a spine with good posture. This means you will experience a healthier, less painful body.  Correct posture must be practiced with all of your activities, especially prolonged sitting and standing. Correct posture is as important when doing repetitive low-stress activities (typing) as it is when doing a single heavy-load activity (lifting).  RESTING POSITIONS Consider which positions are most painful for you when choosing a resting position. If you have pain with flexion-based activities (sitting, bending, stooping, squatting), choose a position that allows you to rest in a less flexed posture. You would want to avoid curling into a fetal position on your side. If your pain worsens with extension-based activities (prolonged standing, working overhead), avoid resting in an extended position such as sleeping on your stomach. Most people will find more comfort when they rest with their spine in a more neutral position, neither too rounded nor too arched. Lying on a non-sagging bed on your side with a pillow between your knees, or on your back with a pillow under your  knees will often provide some relief. Keep in mind, being in any one position for a prolonged period of time, no matter how correct your posture, can still lead to stiffness.  PROPER SITTING POSTURE In order to minimize stress and discomfort on your spine, you must sit with correct posture. Sitting with good posture should be effortless for a healthy body. Returning to good posture is a gradual process. Many people can work toward this most comfortably by using various supports until they have the flexibility and strength to maintain this posture on their own. When sitting with proper posture, your ears will fall over your shoulders and your shoulders will fall over your hips. You should use the back of the chair to support your upper back. Your lower back will be in a neutral position, just slightly arched. You may place a small pillow or folded towel at the base of your lower back for  support.  When working at a desk, create an environment that supports good, upright posture. Without extra support, muscles tire, which leads to excessive strain on joints and other tissues. Keep these recommendations in mind:  CHAIR:  A chair should be able to slide under your desk when your back makes contact with the back of the chair. This allows you to work closely.  The chair's height should allow your eyes to be level with the upper part of your monitor and your hands to be slightly lower than your elbows.  BODY POSITION  Your feet should make contact with the floor. If this is not possible, use a foot rest.  Keep your ears over your shoulders. This will reduce stress on your neck and low back.  INCORRECT SITTING POSTURES  If you are feeling tired and unable to assume a healthy sitting posture, do not slouch or slump. This puts excessive strain on your back tissues, causing more damage and pain. Healthier options include:  Using more support, like a lumbar pillow.  Switching tasks to something that  requires  you to be upright or walking.  Talking a brief walk.  Lying down to rest in a neutral-spine position.  PROLONGED STANDING WHILE SLIGHTLY LEANING FORWARD  When completing a task that requires you to lean forward while standing in one place for a long time, place either foot up on a stationary 2-4 inch high object to help maintain the best posture. When both feet are on the ground, the lower back tends to lose its slight inward curve. If this curve flattens (or becomes too large), then the back and your other joints will experience too much stress, tire more quickly, and can cause pain.  CORRECT STANDING POSTURES Proper standing posture should be assumed with all daily activities, even if they only take a few moments, like when brushing your teeth. As in sitting, your ears should fall over your shoulders and your shoulders should fall over your hips. You should keep a slight tension in your abdominal muscles to brace your spine. Your tailbone should point down to the ground, not behind your body, resulting in an over-extended swayback posture.   INCORRECT STANDING POSTURES  Common incorrect standing postures include a forward head, locked knees and/or an excessive swayback. WALKING Walk with an upright posture. Your ears, shoulders and hips should all line-up.  PROLONGED ACTIVITY IN A FLEXED POSITION When completing a task that requires you to bend forward at your waist or lean over a low surface, try to find a way to stabilize 3 out of 4 of your limbs. You can place a hand or elbow on your thigh or rest a knee on the surface you are reaching across. This will provide you more stability, so that your muscles do not tire as quickly. By keeping your knees relaxed, or slightly bent, you will also reduce stress across your lower back. CORRECT LIFTING TECHNIQUES  DO :  Assume a wide stance. This will provide you more stability and the opportunity to get as close as possible to the object  which you are lifting.  Tense your abdominals to brace your spine. Bend at the knees and hips. Keeping your back locked in a neutral-spine position, lift using your leg muscles. Lift with your legs, keeping your back straight.  Test the weight of unknown objects before attempting to lift them.  Try to keep your elbows locked down at your sides in order get the best strength from your shoulders when carrying an object.     Always ask for help when lifting heavy or awkward objects. INCORRECT LIFTING TECHNIQUES DO NOT:   Lock your knees when lifting, even if it is a small object.  Bend and twist. Pivot at your feet or move your feet when needing to change directions.  Assume that you can safely pick up even a paperclip without proper posture.

## 2019-11-28 ENCOUNTER — Other Ambulatory Visit: Payer: Self-pay | Admitting: Family Medicine

## 2019-11-28 DIAGNOSIS — M545 Low back pain, unspecified: Secondary | ICD-10-CM

## 2019-12-01 ENCOUNTER — Ambulatory Visit (INDEPENDENT_AMBULATORY_CARE_PROVIDER_SITE_OTHER): Payer: 59 | Admitting: Family Medicine

## 2019-12-01 ENCOUNTER — Encounter: Payer: Self-pay | Admitting: Family Medicine

## 2019-12-01 ENCOUNTER — Other Ambulatory Visit: Payer: Self-pay

## 2019-12-01 VITALS — BP 142/94 | HR 83 | Temp 98.5°F | Ht 69.0 in | Wt 322.5 lb

## 2019-12-01 DIAGNOSIS — I10 Essential (primary) hypertension: Secondary | ICD-10-CM

## 2019-12-01 MED ORDER — LOSARTAN POTASSIUM 50 MG PO TABS: 50.0000 mg | ORAL_TABLET | Freq: Every day | ORAL | 3 refills | Status: DC

## 2019-12-01 NOTE — Patient Instructions (Signed)
Keep the diet clean and stay active.  Continue checking your blood pressure at home.  Let us know if you need anything.

## 2019-12-01 NOTE — Progress Notes (Signed)
Chief Complaint  Patient presents with  . Follow-up    recheck BP    Subjective Travis Fields is a 54 y.o. male who presents for hypertension follow up. He does monitor home blood pressures. Blood pressures ranging from 130-140's/80-90's on average. He is compliant with medication- Norvasc 5 mg/d.  Patient has these side effects of medication: none He is sometimes adhering to a healthy diet overall. Current exercise: walking   Past Medical History:  Diagnosis Date  . Allergy   . Asthma   . Chronic kidney disease    kidney stones   . GERD (gastroesophageal reflux disease)    dx'd with GERD 10- 12 yrs ago with EGD   . Gout   . History of chicken pox   . History of kidney stones   . History of shingles   . Hypertension   . Morbid obesity ([place]) 12/05/2015  . OSA on CPAP 12/05/2015  . Seasonal allergies   . Sleep apnea    wears cpap     Exam BP (!) 142/94 (BP Location: Left Arm, Patient Position: Sitting, Cuff Size: Large)   Pulse 83   Temp 98.5 F (36.9 C) (Oral)   Ht 5\' 9"  (1.753 m)   Wt (!) 322 lb 8 oz (146.3 kg)   SpO2 96%   BMI 47.62 kg/m  General:  well developed, well nourished, in no apparent distress Heart: RRR, no bruits, 1+ pitting b/l LE edema Lungs: clear to auscultation, no accessory muscle use Psych: well oriented with normal range of affect and appropriate judgment/insight  Essential hypertension - Plan: losartan (COZAAR) 50 MG tablet, Basic metabolic panel  Continue amlodipine, and losartan.  Check BMP in 3 weeks as he is going to go out of town.  He will start the medicine in 2 weeks.  Continue to check blood pressure at home.  Counseled on diet and exercise. F/u in 6 weeks. The patient voiced understanding and agreement to the plan.  [place], DO 12/01/19  9:12 AM

## 2019-12-20 ENCOUNTER — Ambulatory Visit (INDEPENDENT_AMBULATORY_CARE_PROVIDER_SITE_OTHER): Payer: 59 | Admitting: Family Medicine

## 2019-12-20 ENCOUNTER — Encounter: Payer: Self-pay | Admitting: Family Medicine

## 2019-12-20 ENCOUNTER — Other Ambulatory Visit: Payer: Self-pay

## 2019-12-20 VITALS — BP 146/82 | HR 82 | Temp 98.0°F | Ht 69.0 in | Wt 322.2 lb

## 2019-12-20 DIAGNOSIS — J40 Bronchitis, not specified as acute or chronic: Secondary | ICD-10-CM | POA: Diagnosis not present

## 2019-12-20 DIAGNOSIS — I10 Essential (primary) hypertension: Secondary | ICD-10-CM

## 2019-12-20 MED ORDER — ALBUTEROL SULFATE HFA 108 (90 BASE) MCG/ACT IN AERS: 2.0000 | INHALATION_SPRAY | Freq: Four times a day (QID) | RESPIRATORY_TRACT | 0 refills | Status: DC | PRN

## 2019-12-20 MED ORDER — PREDNISONE 20 MG PO TABS: 40.0000 mg | ORAL_TABLET | Freq: Every day | ORAL | 0 refills | Status: AC

## 2019-12-20 NOTE — Progress Notes (Signed)
Chief Complaint  Patient presents with  . Cough  . Wheezing  . Shortness of Breath    Travis Fields here for URI complaints.  Duration: 1 week  Associated symptoms: rhinorrhea, wheezing, shortness of breath and cough Denies: sinus congestion, sinus pain, itchy watery eyes, ear pain, ear drainage, sore throat, myalgia and fevers Treatment to date: none Sick contacts: No  Past Medical History:  Diagnosis Date  . Allergy   . Asthma   . Chronic kidney disease    kidney stones   . GERD (gastroesophageal reflux disease)    dx'd with GERD 10- 12 yrs ago with EGD   . Gout   . History of chicken pox   . History of kidney stones   . History of shingles   . Hypertension   . Morbid obesity ([place] Fields) 12/05/2015  . OSA on CPAP 12/05/2015  . Seasonal allergies   . Sleep apnea    wears cpap     BP (!) 146/82 (BP Location: Left Arm, Patient Position: Sitting, Cuff Size: Large)   Pulse 82   Temp 98 F (36.7 C) (Oral)   Ht 5\' 9"  (1.753 m)   Wt (!) 322 lb 4 oz (146.2 kg)   SpO2 97%   BMI 47.59 kg/m  General: Awake, alert, appears stated age HEENT: AT, [place], ears patent b/l and TM's neg, nares patent w/o discharge, pharynx pink and without exudates, MMM Neck: No masses or asymmetry Heart: RRR Lungs: +wheezing at bases, no accessory muscle use Psych: Age appropriate judgment and insight, normal mood and affect  Wheezy bronchitis - Plan: predniSONE (DELTASONE) 20 MG tablet, albuterol (VENTOLIN HFA) 108 (90 Base) MCG/ACT inhaler  Essential hypertension  Continue to push fluids, practice good hand hygiene, cover mouth when coughing. Consider getting tested for covid.  Start taking losartan daily.  F/u prn. If starting to experience fevers, shaking, or shortness of breath, seek immediate care. Pt voiced understanding and agreement to the plan.  [place], DO 12/20/19 10:09 AM

## 2019-12-20 NOTE — Patient Instructions (Signed)
Continue to push fluids, practice good hand hygiene, and cover your mouth if you cough.  If you start having fevers, shaking or shortness of breath, seek immediate care.  Start taking your blood pressure medicine.   Let us know if you need anything.

## 2020-01-12 ENCOUNTER — Other Ambulatory Visit: Payer: Self-pay

## 2020-01-12 ENCOUNTER — Encounter: Payer: Self-pay | Admitting: Family Medicine

## 2020-01-12 ENCOUNTER — Ambulatory Visit (INDEPENDENT_AMBULATORY_CARE_PROVIDER_SITE_OTHER): Payer: 59 | Admitting: Family Medicine

## 2020-01-12 VITALS — BP 142/82 | HR 83 | Temp 98.3°F | Ht 69.0 in | Wt 311.5 lb

## 2020-01-12 DIAGNOSIS — I10 Essential (primary) hypertension: Secondary | ICD-10-CM | POA: Diagnosis not present

## 2020-01-12 DIAGNOSIS — R058 Other specified cough: Secondary | ICD-10-CM | POA: Diagnosis not present

## 2020-01-12 DIAGNOSIS — Z23 Encounter for immunization: Secondary | ICD-10-CM

## 2020-01-12 MED ORDER — FLUTICASONE PROPIONATE HFA 110 MCG/ACT IN AERO: 2.0000 | INHALATION_SPRAY | Freq: Two times a day (BID) | RESPIRATORY_TRACT | 3 refills | Status: DC

## 2020-01-12 NOTE — Progress Notes (Signed)
Chief Complaint  Patient presents with  . Follow-up    Subjective Travis Fields is a 54 y.o. male who presents for hypertension follow up. He does monitor home blood pressures. Blood pressures ranging from 140's/80's on average. He is compliant with medication- Norvasc 5 mg/d. Patient has these side effects of medication: none He is adhering to a healthy diet overall. Current exercise: walking more Lost around 14 lbs.   Still has cough after last visit. +hx of asthma, this is typical after a URI. He is not on an ICS. No fevers, wheezing, URI s/s's currently.    Past Medical History:  Diagnosis Date  . Allergy   . Asthma   . Chronic kidney disease    kidney stones   . GERD (gastroesophageal reflux disease)    dx'd with GERD 10- 12 yrs ago with EGD   . Gout   . History of chicken pox   . History of kidney stones   . History of shingles   . Hypertension   . Morbid obesity ([place]) 12/05/2015  . OSA on CPAP 12/05/2015  . Seasonal allergies   . Sleep apnea    wears cpap     Exam BP (!) 142/82 (BP Location: Left Arm, Patient Position: Sitting, Cuff Size: Large)   Pulse 83   Temp 98.3 F (36.8 C) (Oral)   Ht 5\' 9"  (1.753 m)   Wt (!) 311 lb 8 oz (141.3 kg)   SpO2 95%   BMI 46.00 kg/m  General:  well developed, well nourished, in no apparent distress Heart: RRR, no bruits, no LE edema Lungs: clear to auscultation, no accessory muscle use Psych: well oriented with normal range of affect and appropriate judgment/insight  Essential hypertension  Need for influenza vaccination - Plan: Flu Vaccine QUAD 6+ mos PF IM (Fluarix Quad PF)  Post-viral cough syndrome - Plan: fluticasone (FLOVENT HFA) 110 MCG/ACT inhaler  1. Pt has lost >10 lbs. Counseled on diet and exercise, cont wt loss. Cont Norvasc 5 mg/d. Will hold off on adding ARB. He will send readings/alert [place] if worsening BP at home, but cont to monitor. 2. Add ICS as above. Likely temporary.  F/u in 6 weeks. The patient  voiced understanding and agreement to the plan.  [place], DO 01/12/20  9:46 AM

## 2020-01-12 NOTE — Patient Instructions (Signed)
Keep the diet clean and stay active. Really strong work with your weight loss  We can hold off on the 2nd medicine at this point.   Keep checking your blood pressures at home.  Rinse your mouth out with water after using the inhaler.   Let us know if you need anything.

## 2020-01-23 ENCOUNTER — Other Ambulatory Visit: Payer: Self-pay

## 2020-01-23 ENCOUNTER — Encounter: Payer: Self-pay | Admitting: Family Medicine

## 2020-01-23 ENCOUNTER — Telehealth (INDEPENDENT_AMBULATORY_CARE_PROVIDER_SITE_OTHER): Payer: 59 | Admitting: Family Medicine

## 2020-01-23 VITALS — Temp 97.5°F | Ht 69.0 in | Wt 310.0 lb

## 2020-01-23 DIAGNOSIS — J4521 Mild intermittent asthma with (acute) exacerbation: Secondary | ICD-10-CM

## 2020-01-23 MED ORDER — PREDNISONE 20 MG PO TABS: 40.0000 mg | ORAL_TABLET | Freq: Every day | ORAL | 0 refills | Status: AC

## 2020-01-23 NOTE — Progress Notes (Signed)
Chief Complaint  Patient presents with   Cough   Wheezing    Travis Fields here for URI complaints. Due to COVID-19 pandemic, we are interacting via web portal for an electronic face-to-face visit. I verified patient's ID using 2 identifiers. Patient agreed to proceed with visit via this method. Patient is at home, I am at office. Patient and I are present for visit.   Duration: 5 days  Associated symptoms: rhinorrhea, wheezing, shortness of breath and chest tightness Denies: sinus congestion, sinus pain, itchy watery eyes, ear pain, ear drainage, sore throat, myalgia and fevers Treatment to date: ICS Sick contacts: No  Past Medical History:  Diagnosis Date   Allergy    Asthma    Chronic kidney disease    kidney stones    GERD (gastroesophageal reflux disease)    dx'd with GERD 10- 12 yrs ago with EGD    Gout    History of chicken pox    History of kidney stones    History of shingles    Hypertension    Morbid obesity ([place]) 12/05/2015   OSA on CPAP 12/05/2015   Seasonal allergies    Sleep apnea    wears cpap     Temp (!) 97.5 F (36.4 C) (Oral)    Ht 5\' 9"  (1.753 m)    Wt (!) 310 lb (140.6 kg)    BMI 45.78 kg/m  No conversational dyspnea Age appropriate judgment and insight Nml affect and mood  Mild intermittent asthma with acute exacerbation - Plan: predniSONE (DELTASONE) 20 MG tablet  7 d pred burst, 40 mg/d.  He will send a message in a couple days if he is not better.  Would consider azithromycin and possible chest x-ray. Continue to push fluids, practice good hand hygiene, cover mouth when coughing. F/u prn. If starting to experience fevers, shaking, or shortness of breath, seek immediate care. Pt voiced understanding and agreement to the plan.  [place], DO 01/23/20 12:41 PM

## 2020-03-01 ENCOUNTER — Ambulatory Visit: Payer: 59 | Admitting: Family Medicine

## 2020-03-03 ENCOUNTER — Other Ambulatory Visit: Payer: Self-pay | Admitting: Family Medicine

## 2020-03-03 DIAGNOSIS — I10 Essential (primary) hypertension: Secondary | ICD-10-CM

## 2020-03-05 ENCOUNTER — Ambulatory Visit (INDEPENDENT_AMBULATORY_CARE_PROVIDER_SITE_OTHER): Payer: 59 | Admitting: Family Medicine

## 2020-03-05 ENCOUNTER — Encounter: Payer: Self-pay | Admitting: Family Medicine

## 2020-03-05 ENCOUNTER — Other Ambulatory Visit: Payer: Self-pay

## 2020-03-05 VITALS — BP 152/92 | HR 88 | Temp 98.3°F | Ht 69.0 in | Wt 319.4 lb

## 2020-03-05 DIAGNOSIS — I10 Essential (primary) hypertension: Secondary | ICD-10-CM | POA: Diagnosis not present

## 2020-03-05 MED ORDER — CHLORTHALIDONE 25 MG PO TABS: 25.0000 mg | ORAL_TABLET | Freq: Every day | ORAL | 2 refills | Status: DC

## 2020-03-05 NOTE — Patient Instructions (Signed)
Keep the diet clean and stay active.  Check your blood pressures 2-3 times per week, alternating the time of day you check it. If it is high, considering waiting 1-2 minutes and rechecking. If it gets higher, your anxiety is likely creeping up and we should avoid rechecking.   Let us know if you need anything.  

## 2020-03-05 NOTE — Progress Notes (Signed)
Chief Complaint  Patient presents with   Follow-up    Blood pressure     Subjective Travis Fields is a 54 y.o. male who presents for hypertension follow up. He does monitor home blood pressures. Blood pressures ranging from 140's/80-90's on average. He is compliant with medications. Patient has these side effects of medication: none He is sometimes adhering to a healthy diet overall. Current exercise: some walking No CP or new SOB.    Past Medical History:  Diagnosis Date   Allergy    Asthma    Chronic kidney disease    kidney stones    GERD (gastroesophageal reflux disease)    dx'd with GERD 10- 12 yrs ago with EGD    Gout    History of chicken pox    History of kidney stones    History of shingles    Hypertension    Morbid obesity ([place]) 12/05/2015   OSA on CPAP 12/05/2015   Seasonal allergies    Sleep apnea    wears cpap     Exam BP (!) 152/92 (BP Location: Left Arm, Patient Position: Sitting, Cuff Size: Large)    Pulse 88    Temp 98.3 F (36.8 C) (Oral)    Ht 5\' 9"  (1.753 m)    Wt (!) 319 lb 6 oz (144.9 kg)    SpO2 96%    BMI 47.16 kg/m  General:  well developed, well nourished, in no apparent distress Heart: RRR, no bruits, 2+ pitting b/l LE edema tapering at knees Lungs: clear to auscultation, no accessory muscle use Psych: well oriented with normal range of affect and appropriate judgment/insight  Essential hypertension - Plan: chlorthalidone (HYGROTON) 25 MG tablet, Basic metabolic panel  Uncontrolled. Cont Norvasc 5 mg/d. Add Chlorthalidone 25 mg/d. Counseled on diet and exercise. Ck BMP in 1 week.  F/u in 1 mo. The patient voiced understanding and agreement to the plan.  [place], DO 03/05/20  8:45 AM

## 2020-03-14 ENCOUNTER — Other Ambulatory Visit (INDEPENDENT_AMBULATORY_CARE_PROVIDER_SITE_OTHER): Payer: 59

## 2020-03-14 ENCOUNTER — Other Ambulatory Visit: Payer: Self-pay

## 2020-03-14 DIAGNOSIS — I10 Essential (primary) hypertension: Secondary | ICD-10-CM | POA: Diagnosis not present

## 2020-03-14 LAB — BASIC METABOLIC PANEL
BUN: 19 mg/dL (ref 6–23)
CO2: 31 mEq/L (ref 19–32)
Calcium: 9.6 mg/dL (ref 8.4–10.5)
Chloride: 101 mEq/L (ref 96–112)
Creatinine, Ser: 1.13 mg/dL (ref 0.40–1.50)
GFR: 74.16 mL/min (ref >60.00)
Glucose, Bld: 160 mg/dL — ABNORMAL HIGH (ref 70–99)
Potassium: 4 mEq/L (ref 3.5–5.1)
Sodium: 139 mEq/L (ref 135–145)

## 2020-04-05 ENCOUNTER — Ambulatory Visit: Payer: 59 | Admitting: Family Medicine

## 2020-04-12 ENCOUNTER — Ambulatory Visit (INDEPENDENT_AMBULATORY_CARE_PROVIDER_SITE_OTHER): Payer: 59 | Admitting: Family Medicine

## 2020-04-12 ENCOUNTER — Encounter: Payer: Self-pay | Admitting: Family Medicine

## 2020-04-12 ENCOUNTER — Other Ambulatory Visit: Payer: Self-pay

## 2020-04-12 VITALS — BP 138/74 | HR 87 | Resp 18 | Ht 69.0 in | Wt 318.0 lb

## 2020-04-12 DIAGNOSIS — I10 Essential (primary) hypertension: Secondary | ICD-10-CM

## 2020-04-12 NOTE — Patient Instructions (Addendum)
Stay on the current blood pressure medications.  If you do not hear anything about your referral in the next 1-2 weeks, call our office and ask for an update.  Continue to monitor your levels at home. I want your BP <140/90 consistently.   Keep the diet clean and stay active.  Let us know if you need anything.

## 2020-04-12 NOTE — Progress Notes (Signed)
Chief Complaint  Patient presents with  . Hypertension    Subjective Travis Fields is a 55 y.o. male who presents for hypertension follow up. He does monitor home blood pressures. Blood pressures ranging from 140-150's/90's on average. He is compliant with medications- norvasc 5 mg/d, chlorthalidone 25 mg/d. Patient has these side effects of medication: none He is sometimes adhering to a healthy diet overall. Current exercise: active at home, walking No Cp, some sob related to physical deconditioning and/or asthma.  He does struggle with his weight. He is not interested in surgery. Has cut down on unhealthy foods and portions in past without much success.    Past Medical History:  Diagnosis Date  . Allergy   . Asthma   . Chronic kidney disease    kidney stones   . GERD (gastroesophageal reflux disease)    dx'd with GERD 10- 12 yrs ago with EGD   . Gout   . History of chicken pox   . History of kidney stones   . History of shingles   . Hypertension   . Morbid obesity ([place]) 12/05/2015  . OSA on CPAP 12/05/2015  . Seasonal allergies   . Sleep apnea    wears cpap     Exam BP 138/74 (BP Location: Left Arm, Patient Position: Sitting, Cuff Size: Large)   Pulse 87   Resp 18   Ht 5\' 9"  (1.753 m)   Wt (!) 318 lb (144.2 kg)   SpO2 96%   BMI 46.96 kg/m  General:  well developed, well nourished, in no apparent distress Heart: RRR, no bruits, trace pitting b/l LE edema Lungs: clear to auscultation, no accessory muscle use  Psych: well oriented with normal range of affect and appropriate judgment/insight  Essential hypertension  Morbid obesity ([place]) - Plan: Amb Ref to [facility]  1. Cont Norvasc 5 mg/d and chlorthalidone 25 mg/d. Bring BP monitor to nurse visit in 2 weeks to compare. He will debate getting a newone or just seeing what his home monitor shows. If slightly elevated (<150/100) but still not controlled, would increase Norvasc to 10 mg/d. Otherwise  would add ARB. Counseled on diet and exercise. 2. Refer [facility] team after discussing seeing a dietician, no referral or bariatric surg referral.  F/u pending nurse visit results. The patient voiced understanding and agreement to the plan.  [place], DO 04/12/20  9:55 AM

## 2020-04-26 ENCOUNTER — Other Ambulatory Visit: Payer: Self-pay

## 2020-04-26 ENCOUNTER — Ambulatory Visit (INDEPENDENT_AMBULATORY_CARE_PROVIDER_SITE_OTHER): Payer: 59

## 2020-04-26 DIAGNOSIS — I10 Essential (primary) hypertension: Secondary | ICD-10-CM | POA: Diagnosis not present

## 2020-04-26 NOTE — Progress Notes (Signed)
Pt here for Blood pressure check per Dr. Nani Ravens  Pt currently takes:Amlodipine 5 mg daily and Chlorthalidone 25 mg daily  Home Monitor this morning 118/84 at home.   Pt reports compliance with medication.  BP today @ = 128/80 HR =75  BP Readings from Last 3 Encounters:  04/12/20 138/74  03/05/20 (!) 152/92  01/12/20 (!) 142/82     Pt advised per Dr. Nani Ravens, continue current regimen. Follow up IN 6 months. Follow up scheduled.

## 2020-05-28 ENCOUNTER — Encounter (INDEPENDENT_AMBULATORY_CARE_PROVIDER_SITE_OTHER): Payer: Self-pay | Admitting: Family Medicine

## 2020-05-28 ENCOUNTER — Ambulatory Visit (INDEPENDENT_AMBULATORY_CARE_PROVIDER_SITE_OTHER): Payer: 59 | Admitting: Family Medicine

## 2020-05-28 ENCOUNTER — Other Ambulatory Visit: Payer: Self-pay

## 2020-05-28 VITALS — BP 128/76 | HR 75 | Temp 98.2°F | Ht 69.0 in | Wt 303.0 lb

## 2020-05-28 DIAGNOSIS — Z0289 Encounter for other administrative examinations: Secondary | ICD-10-CM

## 2020-05-28 DIAGNOSIS — R5383 Other fatigue: Secondary | ICD-10-CM

## 2020-05-28 DIAGNOSIS — G4733 Obstructive sleep apnea (adult) (pediatric): Secondary | ICD-10-CM | POA: Diagnosis not present

## 2020-05-28 DIAGNOSIS — I10 Essential (primary) hypertension: Secondary | ICD-10-CM

## 2020-05-28 DIAGNOSIS — Z9989 Dependence on other enabling machines and devices: Secondary | ICD-10-CM

## 2020-05-28 DIAGNOSIS — R0602 Shortness of breath: Secondary | ICD-10-CM | POA: Diagnosis not present

## 2020-05-28 DIAGNOSIS — E782 Mixed hyperlipidemia: Secondary | ICD-10-CM

## 2020-05-28 DIAGNOSIS — Z1331 Encounter for screening for depression: Secondary | ICD-10-CM

## 2020-05-28 DIAGNOSIS — Z9189 Other specified personal risk factors, not elsewhere classified: Secondary | ICD-10-CM

## 2020-05-28 DIAGNOSIS — E66813 Obesity, class 3: Secondary | ICD-10-CM

## 2020-05-28 DIAGNOSIS — R7303 Prediabetes: Secondary | ICD-10-CM | POA: Diagnosis not present

## 2020-05-28 DIAGNOSIS — Z6841 Body Mass Index (BMI) 40.0 and over, adult: Secondary | ICD-10-CM

## 2020-05-28 NOTE — Telephone Encounter (Signed)
Would you prefer a virtual or in office visit for this follow up

## 2020-05-29 ENCOUNTER — Other Ambulatory Visit: Payer: Self-pay

## 2020-05-29 ENCOUNTER — Encounter: Payer: Self-pay | Admitting: Family Medicine

## 2020-05-29 ENCOUNTER — Ambulatory Visit (INDEPENDENT_AMBULATORY_CARE_PROVIDER_SITE_OTHER): Payer: 59 | Admitting: Family Medicine

## 2020-05-29 VITALS — BP 130/70 | HR 73 | Temp 98.0°F | Resp 18 | Ht 69.0 in | Wt 307.6 lb

## 2020-05-29 DIAGNOSIS — R9431 Abnormal electrocardiogram [ECG] [EKG]: Secondary | ICD-10-CM | POA: Diagnosis not present

## 2020-05-29 DIAGNOSIS — Z23 Encounter for immunization: Secondary | ICD-10-CM

## 2020-05-29 DIAGNOSIS — E1165 Type 2 diabetes mellitus with hyperglycemia: Secondary | ICD-10-CM

## 2020-05-29 HISTORY — DX: Type 2 diabetes mellitus with hyperglycemia: E11.65

## 2020-05-29 LAB — VITAMIN B12: Vitamin B-12: 612 pg/mL (ref 232–1245)

## 2020-05-29 LAB — CBC WITH DIFFERENTIAL/PLATELET
Basophils Absolute: 0.1 10*3/uL (ref 0.0–0.2)
Basos: 1 %
EOS (ABSOLUTE): 0.2 10*3/uL (ref 0.0–0.4)
Eos: 3 %
Hematocrit: 49.8 % (ref 37.5–51.0)
Hemoglobin: 16.5 g/dL (ref 13.0–17.7)
Immature Grans (Abs): 0 10*3/uL (ref 0.0–0.1)
Immature Granulocytes: 0 %
Lymphocytes Absolute: 1.8 10*3/uL (ref 0.7–3.1)
Lymphs: 27 %
MCH: 28.7 pg (ref 26.6–33.0)
MCHC: 33.1 g/dL (ref 31.5–35.7)
MCV: 87 fL (ref 79–97)
Monocytes Absolute: 0.7 10*3/uL (ref 0.1–0.9)
Monocytes: 10 %
Neutrophils Absolute: 4.1 10*3/uL (ref 1.4–7.0)
Neutrophils: 59 %
Platelets: 152 10*3/uL (ref 150–450)
RBC: 5.74 x10E6/uL (ref 4.14–5.80)
RDW: 13.4 % (ref 11.6–15.4)
WBC: 6.9 10*3/uL (ref 3.4–10.8)

## 2020-05-29 LAB — HEMOGLOBIN A1C
Est. average glucose Bld gHb Est-mCnc: 192 mg/dL
Hgb A1c MFr Bld: 8.3 % — ABNORMAL HIGH (ref 4.8–5.6)

## 2020-05-29 LAB — COMPREHENSIVE METABOLIC PANEL
ALT: 52 IU/L — ABNORMAL HIGH (ref 0–44)
AST: 41 IU/L — ABNORMAL HIGH (ref 0–40)
Albumin/Globulin Ratio: 1.8 (ref 1.2–2.2)
Albumin: 4.8 g/dL (ref 3.8–4.9)
Alkaline Phosphatase: 79 IU/L (ref 44–121)
BUN/Creatinine Ratio: 13 (ref 9–20)
BUN: 15 mg/dL (ref 6–24)
Bilirubin Total: 0.7 mg/dL (ref 0.0–1.2)
CO2: 21 mmol/L (ref 20–29)
Calcium: 10.1 mg/dL (ref 8.7–10.2)
Chloride: 97 mmol/L (ref 96–106)
Creatinine, Ser: 1.13 mg/dL (ref 0.76–1.27)
Globulin, Total: 2.6 g/dL (ref 1.5–4.5)
Glucose: 131 mg/dL — ABNORMAL HIGH (ref 65–99)
Potassium: 3.7 mmol/L (ref 3.5–5.2)
Sodium: 140 mmol/L (ref 134–144)
Total Protein: 7.4 g/dL (ref 6.0–8.5)
eGFR: 78 mL/min/{1.73_m2} (ref >59)

## 2020-05-29 LAB — LIPID PANEL WITH LDL/HDL RATIO
Cholesterol, Total: 208 mg/dL — ABNORMAL HIGH (ref 100–199)
HDL: 43 mg/dL (ref >39)
LDL Chol Calc (NIH): 147 mg/dL — ABNORMAL HIGH (ref 0–99)
LDL/HDL Ratio: 3.4 ratio (ref 0.0–3.6)
Triglycerides: 100 mg/dL (ref 0–149)
VLDL Cholesterol Cal: 18 mg/dL (ref 5–40)

## 2020-05-29 LAB — T4: T4, Total: 5.8 ug/dL (ref 4.5–12.0)

## 2020-05-29 LAB — VITAMIN D 25 HYDROXY (VIT D DEFICIENCY, FRACTURES): Vit D, 25-Hydroxy: 30.1 ng/mL (ref 30.0–100.0)

## 2020-05-29 LAB — FOLATE: Folate: 18.8 ng/mL (ref >3.0)

## 2020-05-29 LAB — TSH: TSH: 2.24 u[IU]/mL (ref 0.450–4.500)

## 2020-05-29 LAB — INSULIN, RANDOM: INSULIN: 46.3 u[IU]/mL — ABNORMAL HIGH (ref 2.6–24.9)

## 2020-05-29 LAB — T3: T3, Total: 133 ng/dL (ref 71–180)

## 2020-05-29 MED ORDER — ROSUVASTATIN CALCIUM 20 MG PO TABS
20.0000 mg | ORAL_TABLET | Freq: Every day | ORAL | 3 refills | Status: DC
Start: 2020-05-29 — End: 2021-08-12

## 2020-05-29 MED ORDER — OLMESARTAN MEDOXOMIL 20 MG PO TABS
20.0000 mg | ORAL_TABLET | Freq: Every day | ORAL | 2 refills | Status: DC
Start: 2020-05-29 — End: 2020-07-29

## 2020-05-29 MED ORDER — METFORMIN HCL 500 MG PO TABS: ORAL_TABLET | ORAL | 1 refills | Status: DC

## 2020-05-29 NOTE — Patient Instructions (Addendum)
If you do not hear anything about your referrals in the next 1-2 weeks, call our office and ask for an update.  Give [place] 2-3 business days to get the results of your labs back.   Continue checking your blood pressure at home. Send me a message if still high.   Call your insurance company to see what blood sugar monitor they will cover and send me a message.   Check your sugars around 2-3 times per week. Alternate checking in the morning before you eat, in the afternoon and before bed. Write them down and bring it to your next appointment.   Let us know if you need anything.

## 2020-05-29 NOTE — Progress Notes (Signed)
Chief Complaint  Patient presents with  . Follow-up    Pt states he [facility] and states he was told there was were some concerns.    Subjective: Patient is a 55 y.o. male here for f/u from [facility] visit yesterday.  Patient was seen by the weight loss clinic and had an EKG that showed ST segment depression.  He was sent to his PCPs office for follow-up.  He does not have any chest pain but does get exertional shortness of breath that improves the more he exercises.  He has a history of hypertension currently controlled on chlorthalidone 25 mg daily and amlodipine 5 mg daily.  He is compliant with medications.  Diet could be better.  His family history is unknown.  He is a non-smoker.  He has never seen a cardiologist.  He has a history of prediabetes but yesterday his A1c came back at 8.3.  He does not routinely follow with an eye doctor.  He has never had a foot exam.  He has never had a pneumonia vaccine.  He is not on a statin.  He has never checked his sugars before.  Past Medical History:  Diagnosis Date  . Allergy   . Asthma   . Back pain   . Bilateral swelling of feet   . Chronic kidney disease    kidney stones   . GERD (gastroesophageal reflux disease)    dx'd with GERD 10- 12 yrs ago with EGD   . Gout   . Heartburn   . High cholesterol   . History of chicken pox   . History of kidney stones   . History of shingles   . Hypertension   . Joint pain   . Morbid obesity ([place]) 12/05/2015  . OSA on CPAP 12/05/2015  . Seasonal allergies   . Sleep apnea    wears cpap   . SOB (shortness of breath)   . Swallowing difficulty     Objective: BP 130/70 (BP Location: Left Arm, Patient Position: Sitting, Cuff Size: Large)   Pulse 73   Temp 98 F (36.7 C) (Oral)   Resp 18   Ht 5\' 9"  (1.753 m)   Wt (!) 307 lb 9.6 oz (139.5 kg)   SpO2 96%   BMI 45.42 kg/m  General: Awake, appears stated age HEENT: MMM, EOMi Heart: RRR Neuro: Sensation  intact to pinprick in bilateral feet Skin: Slight callus buildup on the plantar surface of the forefoot bilaterally; no lesions or sores noted, no signs of fungus Lungs: CTAB, no rales, wheezes or rhonchi. No accessory muscle use Psych: Age appropriate judgment and insight, normal affect and mood  Assessment and Plan: Abnormal EKG - Plan: Ambulatory referral to Cardiology  Type 2 diabetes mellitus with hyperglycemia, without long-term current use of insulin (HCC) - Plan: olmesartan (BENICAR) 20 MG tablet, metFORMIN (GLUCOPHAGE) 500 MG tablet, rosuvastatin (CRESTOR) 20 MG tablet, Microalbumin / creatinine urine ratio, Lipid panel, Comprehensive metabolic panel, Ambulatory referral to Ophthalmology, [facility] DIABETES FOOT EXAM  Need for 23-polyvalent pneumococcal polysaccharide vaccine - Plan: Pneumococcal polysaccharide vaccine 23-valent greater than or equal to 2yo subcutaneous/IM  1.  Refer to cardiology.  No emergent symptoms. 2.  Start Metformin and Crestor.  We will change amlodipine to olmesartan for renal protection.  He will return in 2 weeks to recheck blood work and also provide a microalbumin creatinine ratio.  We will refer him to the ophthalmology team.  Foot exam  was done today.  He was also given the pneumonia vaccine. He will contact his insurance company to see what glucose meter they will cover and let us know.  We will then let us know and we can send it in. I will see him in 3 months to recheck his sugars. The patient voiced understanding and agreement to the plan.  [place], DO 05/29/20  3:44 PM

## 2020-05-30 NOTE — Progress Notes (Signed)
Dear Dr. Nani Fields,   Thank you for referring Travis Fields to our clinic. The following note includes my evaluation and treatment recommendations.  Chief Complaint:   OBESITY Travis Fields (MR# [id number]) is a 55 y.o. male who presents for evaluation and treatment of obesity and related comorbidities. Current BMI is Body mass index is 44.75 kg/m. Travis Fields has been struggling with his weight for many years and has been unsuccessful in either losing weight, maintaining weight loss, or reaching his healthy weight goal.  Travis Fields is currently in the action stage of change and ready to dedicate time achieving and maintaining a healthier weight. Travis Fields is interested in becoming our patient and working on intensive lifestyle modifications including (but not limited to) diet and exercise for weight loss.  Travis Fields was referred by Dr. Nani Fields. Pt most successful previously with [facility]. Breakfast- [employer] 2 sandwiches sausage mcmuffin with egg + sausage biscuit + has brown + coke (M/L) (eat and drink all- felt satisfied); Lunch- 2 mini pizzas or sandwich ham/turkey/salami (2 slices each), mustard + pickles + chips (1/2 plate) with 1 can soda or sparkling water with individual cookie pack; Dinner- ordering out [place] > 3 tacos and rice + beans with cheese dip on side (feel full); After dinner snack- another pack of cookies or bowl of ice cream (mint chocolate chip 1.5 cups).  Travis Fields's habits were reviewed today and are as follows: His family eats meals together, he thinks his family will eat healthier with him, his desired weight loss is 78 lbs, he has been heavy most of his life, he started gaining weight 5 years after college, his heaviest weight ever was 325 pounds, he is a picky eater and doesn't like to eat healthier foods, he has significant food cravings issues, he snacks frequently in the evenings, he is frequently drinking liquids with calories, he frequently makes poor food choices, he has problems  with excessive hunger, he frequently eats larger portions than normal, he has binge eating behaviors and he struggles with emotional eating.  Depression Screen Travis Fields Food and Mood (modified PHQ-9) score was 8.  Depression screen PHQ 2/9 05/28/2020  Decreased Interest 1  Down, Depressed, Hopeless 1  PHQ - 2 Score 2  Altered sleeping 1  Tired, decreased energy 2  Change in appetite 2  Feeling bad or failure about yourself  1  Trouble concentrating 0  Moving slowly or fidgety/restless 0  Suicidal thoughts 0  PHQ-9 Score 8  Difficult doing work/chores Not difficult at all   Subjective:   1. Other fatigue Travis Fields admits to daytime somnolence and admits to waking up still tired. [place] has a history of symptoms of daytime fatigue. [name] generally gets 6-8 hours of sleep per night, and states that he has generally restful sleep. Snoring is present. Apneic episodes are present. Epworth Sleepiness Score is 6. EKG- ST depression specifically in II, III, aVF.  2. SOB (shortness of breath) on exertion Travis Fields notes increasing shortness of breath with exercising and seems to be worsening over time with weight gain. He notes getting out of breath sooner with activity than he used to. This has gotten worse recently. Travis Fields denies shortness of breath at rest or orthopnea. EKG- ST depression specifically in II, III, aVF.  3. Essential hypertension Pt is on Hygro[place] and amlodipine. He has been on meds since approximately 3-4 months ago. He is unsure how long his BP has been labile.   4. OSA on CPAP Pt has CPAP with almost 100% compliance.  He was diagnosed about 10 years ago.   5. Pre-diabetes Pt's last A1c was 6.2. He is not on anti-diabetic meds.  6. Mixed hyperlipidemia Pt's last LDL 127, HDL 43.6, and triglycerides 124. He is not on statin therapy.  7. At risk for diabetes mellitus Travis Fields is at higher than average risk for developing diabetes due to obesity.   Assessment/Plan:   1.  Other fatigue [name] does feel that his weight is causing his energy to be lower than it should be. Fatigue may be related to obesity, depression or many other causes. Labs will be ordered, and in the meanwhile, [name] will focus on self care including making healthy food choices, increasing physical activity and focusing on stress reduction. Check labs today.  - EKG 12-Lead - Vitamin B12 - CBC with Differential/Platelet - Folate - T3 - T4 - TSH - VITAMIN D 25 Hydroxy (Vit-D Deficiency, Fractures)  2. SOB (shortness of breath) on exertion Travis Fields does feel that he gets out of breath more easily that he used to when he exercises. [name]'s shortness of breath appears to be obesity related and exercise induced. He has agreed to work on weight loss and gradually increase exercise to treat his exercise induced shortness of breath. Will continue to monitor closely.  3. Essential hypertension Travis Fields is working on healthy weight loss and exercise to improve blood pressure control. We will watch for signs of hypotension as he continues his lifestyle modifications. Check labs today.  - Comprehensive metabolic panel  4. OSA on CPAP Intensive lifestyle modifications are the first line treatment for this issue. We discussed several lifestyle modifications today and he will continue to work on diet, exercise and weight loss efforts. We will continue to monitor. Orders and follow up as documented in patient record. Follow up at subsequent appointments.  5. Pre-diabetes Travis Fields will continue to work on weight loss, exercise, and decreasing simple carbohydrates to help decrease the risk of diabetes. Check labs today.  - Hemoglobin A1c - Insulin, random  6. Mixed hyperlipidemia Cardiovascular risk and specific lipid/LDL goals reviewed.  We discussed several lifestyle modifications today and Travis Fields will continue to work on diet, exercise and weight loss efforts. Orders and follow up as documented in patient  record. Check labs today.  Counseling Intensive lifestyle modifications are the first line treatment for this issue. . Dietary changes: Increase soluble fiber. Decrease simple carbohydrates. . Exercise changes: Moderate to vigorous-intensity aerobic activity 150 minutes per week if tolerated. . Lipid-lowering medications: see documented in medical record.  - Lipid Panel With LDL/HDL Ratio  7. Depression screening Travis Fields had a positive depression screening. Depression is commonly associated with obesity and often results in emotional eating behaviors. We will monitor this closely and work on CBT to help improve the non-hunger eating patterns. Referral to Psychology may be required if no improvement is seen as he continues in our clinic.  8. At risk for diabetes mellitus Travis Fields was given approximately 15 minutes of diabetes education and counseling today. We discussed intensive lifestyle modifications today with an emphasis on weight loss as well as increasing exercise and decreasing simple carbohydrates in his diet. We also reviewed medication options with an emphasis on risk versus benefit of those discussed.   Repetitive spaced learning was employed today to elicit superior memory formation and behavioral change.  9. Class 3 severe obesity with serious comorbidity and body mass index (BMI) of 45.0 to 49.9 in adult, unspecified obesity type Travis Fields) Travis Fields is currently in the action stage  of change and his goal is to continue with weight loss efforts. I recommend Travis Fields begin the structured treatment plan as follows:  He has agreed to the Category 4 Plan.  Exercise goals: No exercise has been prescribed at this time.   Behavioral modification strategies: increasing lean protein intake, meal planning and cooking strategies, keeping healthy foods in the home and planning for success.  He was informed of the importance of frequent follow-up visits to maximize his success with intensive lifestyle  modifications for his multiple health conditions. He was informed we would discuss his lab results at his next visit unless there is a critical issue that needs to be addressed sooner. Travis Fields agreed to keep his next visit at the agreed upon time to discuss these results.  Objective:   Blood pressure 128/76, pulse 75, temperature 98.2 F (36.8 C), temperature source Oral, height 5\' 9"  (1.753 m), weight (!) 303 lb (137.4 kg), SpO2 97 %. Body mass index is 44.75 kg/m.  EKG: Abnormal sinus rhythm, rate 77. ST depression specifically in II, III, aVF  Indirect Calorimeter completed today shows a VO2 of 463 and a REE of 3224.  His calculated basal metabolic rate is 7062 thus his basal metabolic rate is better than expected.  General: Cooperative, alert, well developed, in no acute distress. HEENT: Conjunctivae and lids unremarkable. Cardiovascular: Regular rhythm.  Lungs: Normal work of breathing. Neurologic: No focal deficits.   Lab Results  Component Value Date   CREATININE 1.13 05/28/2020   BUN 15 05/28/2020   NA 140 05/28/2020   K 3.7 05/28/2020   CL 97 05/28/2020   CO2 21 05/28/2020   Lab Results  Component Value Date   ALT 52 (H) 05/28/2020   AST 41 (H) 05/28/2020   ALKPHOS 79 05/28/2020   BILITOT 0.7 05/28/2020   Lab Results  Component Value Date   HGBA1C 8.3 (H) 05/28/2020   HGBA1C 6.2 10/25/2017   HGBA1C 5.9 12/05/2015   Lab Results  Component Value Date   INSULIN 46.3 (H) 05/28/2020   Lab Results  Component Value Date   TSH 2.240 05/28/2020   Lab Results  Component Value Date   CHOL 208 (H) 05/28/2020   HDL 43 05/28/2020   LDLCALC 147 (H) 05/28/2020   TRIG 100 05/28/2020   CHOLHDL 4 10/25/2017   Lab Results  Component Value Date   WBC 6.9 05/28/2020   HGB 16.5 05/28/2020   HCT 49.8 05/28/2020   MCV 87 05/28/2020   PLT 152 05/28/2020     Attestation Statements:   Reviewed by clinician on day of visit: allergies, medications, problem list, medical  history, surgical history, family history, social history, and previous encounter notes.  This is the patient's first visit at [facility]. The patient's NEW PATIENT PACKET was reviewed at length. Included in the packet: current and past health history, medications, allergies, ROS, gynecologic history (women only), surgical history, family history, social history, weight history, weight loss Fields history (for those that have had weight loss Fields), nutritional evaluation, mood and food questionnaire, PHQ9, Epworth questionnaire, sleep habits questionnaire, patient life and health improvement goals questionnaire. These will all be scanned into the patient's chart under media.   During the visit, I independently reviewed the patient's EKG, bioimpedance scale results, and indirect calorimeter results. I used this information to tailor a meal plan for the patient that will help him to lose weight and will improve his obesity-related conditions going forward. I performed a medically necessary appropriate  examination and/or evaluation. I discussed the assessment and treatment plan with the patient. The patient was provided an opportunity to ask questions and all were answered. The patient agreed with the plan and demonstrated an understanding of the instructions. Labs were ordered at this visit and will be reviewed at the next visit unless more critical results need to be addressed immediately. Clinical information was updated and documented in the EMR.   Time spent on visit including pre-visit chart review and post-visit care was 45 minutes.   A separate 15 minutes was spent on risk counseling (see above).    Coral Ceo, am acting as transcriptionist for Coralie Common, MD.   I have reviewed the above documentation for accuracy and completeness, and I agree with the above. - Jinny Blossom, MD

## 2020-06-03 ENCOUNTER — Other Ambulatory Visit: Payer: Self-pay | Admitting: Family Medicine

## 2020-06-03 MED ORDER — FREESTYLE SYSTEM KIT: PACK | 0 refills | Status: DC

## 2020-06-03 MED ORDER — FREESTYLE LANCETS MISC: 3 refills | Status: DC

## 2020-06-03 MED ORDER — GLUCOSE BLOOD VI STRP: ORAL_STRIP | 3 refills | Status: DC

## 2020-06-05 DIAGNOSIS — I10 Essential (primary) hypertension: Secondary | ICD-10-CM

## 2020-06-05 MED ORDER — CHLORTHALIDONE 25 MG PO TABS: 25.0000 mg | ORAL_TABLET | Freq: Every day | ORAL | 2 refills | Status: DC

## 2020-06-10 DIAGNOSIS — M7989 Other specified soft tissue disorders: Secondary | ICD-10-CM | POA: Insufficient documentation

## 2020-06-10 DIAGNOSIS — R12 Heartburn: Secondary | ICD-10-CM | POA: Insufficient documentation

## 2020-06-10 DIAGNOSIS — M2550 Pain in unspecified joint: Secondary | ICD-10-CM | POA: Insufficient documentation

## 2020-06-10 DIAGNOSIS — R0602 Shortness of breath: Secondary | ICD-10-CM | POA: Insufficient documentation

## 2020-06-10 DIAGNOSIS — G4730 Sleep apnea, unspecified: Secondary | ICD-10-CM | POA: Insufficient documentation

## 2020-06-10 DIAGNOSIS — I10 Essential (primary) hypertension: Secondary | ICD-10-CM | POA: Insufficient documentation

## 2020-06-10 DIAGNOSIS — T7840XA Allergy, unspecified, initial encounter: Secondary | ICD-10-CM | POA: Insufficient documentation

## 2020-06-10 DIAGNOSIS — N189 Chronic kidney disease, unspecified: Secondary | ICD-10-CM | POA: Insufficient documentation

## 2020-06-10 DIAGNOSIS — E7800 Pure hypercholesterolemia, unspecified: Secondary | ICD-10-CM | POA: Insufficient documentation

## 2020-06-10 DIAGNOSIS — Z87442 Personal history of urinary calculi: Secondary | ICD-10-CM | POA: Insufficient documentation

## 2020-06-10 DIAGNOSIS — Z8619 Personal history of other infectious and parasitic diseases: Secondary | ICD-10-CM | POA: Insufficient documentation

## 2020-06-10 DIAGNOSIS — M549 Dorsalgia, unspecified: Secondary | ICD-10-CM | POA: Insufficient documentation

## 2020-06-10 DIAGNOSIS — R1310 Dysphagia, unspecified: Secondary | ICD-10-CM | POA: Insufficient documentation

## 2020-06-10 DIAGNOSIS — J302 Other seasonal allergic rhinitis: Secondary | ICD-10-CM | POA: Insufficient documentation

## 2020-06-11 ENCOUNTER — Ambulatory Visit (INDEPENDENT_AMBULATORY_CARE_PROVIDER_SITE_OTHER): Payer: 59 | Admitting: Family Medicine

## 2020-06-11 ENCOUNTER — Encounter (INDEPENDENT_AMBULATORY_CARE_PROVIDER_SITE_OTHER): Payer: Self-pay | Admitting: Family Medicine

## 2020-06-11 ENCOUNTER — Other Ambulatory Visit: Payer: Self-pay

## 2020-06-11 ENCOUNTER — Ambulatory Visit (INDEPENDENT_AMBULATORY_CARE_PROVIDER_SITE_OTHER): Payer: 59 | Admitting: Cardiology

## 2020-06-11 ENCOUNTER — Encounter: Payer: Self-pay | Admitting: Cardiology

## 2020-06-11 VITALS — BP 110/78 | HR 72 | Ht 69.0 in | Wt 298.0 lb

## 2020-06-11 VITALS — BP 119/74 | HR 65 | Temp 98.1°F | Ht 69.0 in | Wt 294.0 lb

## 2020-06-11 DIAGNOSIS — E1165 Type 2 diabetes mellitus with hyperglycemia: Secondary | ICD-10-CM | POA: Diagnosis not present

## 2020-06-11 DIAGNOSIS — Z9989 Dependence on other enabling machines and devices: Secondary | ICD-10-CM

## 2020-06-11 DIAGNOSIS — E559 Vitamin D deficiency, unspecified: Secondary | ICD-10-CM | POA: Diagnosis not present

## 2020-06-11 DIAGNOSIS — R7401 Elevation of levels of liver transaminase levels: Secondary | ICD-10-CM | POA: Diagnosis not present

## 2020-06-11 DIAGNOSIS — I10 Essential (primary) hypertension: Secondary | ICD-10-CM

## 2020-06-11 DIAGNOSIS — G4733 Obstructive sleep apnea (adult) (pediatric): Secondary | ICD-10-CM

## 2020-06-11 DIAGNOSIS — Z9189 Other specified personal risk factors, not elsewhere classified: Secondary | ICD-10-CM | POA: Diagnosis not present

## 2020-06-11 DIAGNOSIS — E785 Hyperlipidemia, unspecified: Secondary | ICD-10-CM | POA: Diagnosis not present

## 2020-06-11 DIAGNOSIS — R9431 Abnormal electrocardiogram [ECG] [EKG]: Secondary | ICD-10-CM

## 2020-06-11 DIAGNOSIS — Z6841 Body Mass Index (BMI) 40.0 and over, adult: Secondary | ICD-10-CM

## 2020-06-11 MED ORDER — VITAMIN D (ERGOCALCIFEROL) 1.25 MG (50000 UNIT) PO CAPS: 50000.0000 [IU] | ORAL_CAPSULE | ORAL | 0 refills | Status: DC

## 2020-06-11 NOTE — Progress Notes (Signed)
Cardiology Consultation:    Date:  06/11/2020   ID:  Candice Camp, DOB 1965/08/26, MRN [id number]  PCP:  Shelda Pal, DO  Cardiologist:  Jenne Campus, MD   Referring MD: Shelda Pal*   Chief Complaint  Patient presents with  . Abnormal ECG    History of Present Illness:    Travis Fields is a 55 y.o. male who is being seen today for the evaluation of abnormal EKG at the request of Shelda Pal*.  Past medical history significant for hypertension for many years, dyslipidemia, recently diagnosed diabetes mellitus.  He also got morbid obesity as well as obstructive sleep apnea.  He did have routine examination done he was find to have EKG changes in form of nonspecific ST segment depression inferior lateral leads.  He was referred to [place].  He denies have any chest pain tightness squeezing pressure burning chest he does have some exertional shortness of breath.  He is being a blocked episode he does not know much about his family.  He does have dyslipidemia that being recently initiated with therapy.  He also have sleep apnea for which he uses CPAP mask. Recently he was diagnosed with diabetes a lot of changes in his life are being made since that time.  He started exercising on the regular basis he also tried significantly his diet.  He weighed went to weight management program and he is enjoying it.  Past Medical History:  Diagnosis Date  . Allergy   . Asthma   . Back pain   . Bilateral swelling of feet   . Chronic kidney disease    kidney stones   . Essential hypertension 03/29/2017  . GERD (gastroesophageal reflux disease)    dx'd with GERD 10- 12 yrs ago with EGD   . Gout   . Heartburn   . High cholesterol   . History of chicken pox   . History of kidney stones   . History of shingles   . Hypertension   . Joint pain   . Morbid obesity ([place]) 12/05/2015  . OSA on CPAP 12/05/2015  . Right foot pain 06/07/2017  . Seasonal allergies   .  Sleep apnea    wears cpap   . SOB (shortness of breath)   . Swallowing difficulty   . Type 2 diabetes mellitus with hyperglycemia, without long-term current use of insulin ([place]) 05/29/2020    Past Surgical History:  Procedure Laterality Date  . INNER EAR SURGERY    . UPPER GASTROINTESTINAL ENDOSCOPY      Current Medications: Current Meds  Medication Sig  . albuterol (VENTOLIN HFA) 108 (90 Base) MCG/ACT inhaler Inhale 2 puffs into the lungs every 6 (six) hours as needed for wheezing or shortness of breath.  . cetirizine (ZYRTEC) 10 MG tablet Take 10 mg by mouth daily.  . chlorthalidone (HYGROTON) 25 MG tablet Take 1 tablet (25 mg total) by mouth daily.  Marland Kitchen esomeprazole (NEXIUM) 20 MG capsule Take 20 mg by mouth daily at 12 noon.  . fluticasone (FLOVENT HFA) 110 MCG/ACT inhaler Inhale 2 puffs into the lungs in the morning and at bedtime.  Marland Kitchen glucose monitoring kit (FREESTYLE) monitoring kit Use daily to check blood sugar.  DXE11.9 (Patient taking differently: 1 each by Other route as needed for other (Glucose check). Use daily to check blood sugar.  DXE11.9)  . Lancets (FREESTYLE) lancets 1Use daily to check blood sugar.  DXE11.9 (Patient taking differently: 1 each by Other route as  needed for other (Glucose check). 1Use daily to check blood sugar.  DXE11.9)  . metFORMIN (GLUCOPHAGE) 500 MG tablet Week 1: 1 tab daily Week 2: 1 tab twice daily Week 3: 2 tabs in AM, 1 in evening Week 4: 2 tabs twice daily (Patient taking differently: Take 1,000 mg by mouth 2 (two) times daily with a meal. Week 1: 1 tab daily Week 2: 1 tab twice daily Week 3: 2 tabs in AM, 1 in evening Week 4: 2 tabs twice daily)  . Multiple Vitamins-Minerals (MENS MULTIVITAMIN) TABS Take 1 capsule by mouth daily.  Marland Kitchen olmesartan (BENICAR) 20 MG tablet Take 1 tablet (20 mg total) by mouth daily.  . rosuvastatin (CRESTOR) 20 MG tablet Take 1 tablet (20 mg total) by mouth daily.     Allergies:   Patient has no known allergies.    Social History   Socioeconomic History  . Marital status: Married    Spouse name: Not on file  . Number of children: Not on file  . Years of education: Not on file  . Highest education level: Not on file  Occupational History  . Occupation: Engineer, production  Tobacco Use  . Smoking status: Never Smoker  . Smokeless tobacco: Never Used  Vaping Use  . Vaping Use: Never used  Substance and Sexual Activity  . Alcohol use: No  . Drug use: No  . Sexual activity: Not on file  Other Topics Concern  . Not on file  Social History Narrative  . Not on file   Social Determinants of Health   Financial Resource Strain: Not on file  Food Insecurity: Not on file  Transportation Needs: Not on file  Physical Activity: Not on file  Stress: Not on file  Social Connections: Not on file     Family History: The patient's family history is negative for Cancer. He was adopted. ROS:   Please see the history of present illness.    All 14 point review of systems negative except as described per history of present illness.  EKGs/Labs/Other Studies Reviewed:    The following studies were reviewed today:   EKG:  EKG is  ordered today.  The ekg ordered today demonstrates normal sinus rhythm normal P interval nonspecific ST segment changes  Recent Labs: 05/28/2020: ALT 52; BUN 15; Creatinine, Ser 1.13; Hemoglobin 16.5; Platelets 152; Potassium 3.7; Sodium 140; TSH 2.240  Recent Lipid Panel    Component Value Date/Time   CHOL 208 (H) 05/28/2020 1019   TRIG 100 05/28/2020 1019   HDL 43 05/28/2020 1019   CHOLHDL 4 10/25/2017 1338   VLDL 24.8 10/25/2017 1338   LDLCALC 147 (H) 05/28/2020 1019    Physical Exam:    VS:  BP 110/78 (BP Location: Left Arm, Patient Position: Sitting)   Pulse 72   Ht _0  (1.753 m)   Wt 298 lb (135.2 kg)   SpO2 97%   BMI 44.01 kg/m     Wt Readings from Last 3 Encounters:  06/11/20 298 lb (135.2 kg)  05/29/20 (!) 307 lb 9.6 oz (139.5 kg)  05/28/20  (!) 303 lb (137.4 kg)     GEN:  Well nourished, well developed in no acute distress HEENT: Normal NECK: No JVD; No carotid bruits LYMPHATICS: No lymphadenopathy CARDIAC: RRR, no murmurs, no rubs, no gallops RESPIRATORY:  Clear to auscultation without rales, wheezing or rhonchi  ABDOMEN: Soft, non-tender, non-distended MUSCULOSKELETAL:  No edema; No deformity  SKIN: Warm and dry NEUROLOGIC:  Alert and  oriented x 3 PSYCHIATRIC:  Normal affect   ASSESSMENT:    1. Essential hypertension   2. OSA on CPAP   3. Morbid obesity ([place])   4. Type 2 diabetes mellitus with hyperglycemia, without long-term current use of insulin (HCC)   5. Nonspecific abnormal electrocardiogram (ECG) (EKG)    PLAN:    In order of problems listed above:  1. Abnormal EKG.  Minor changes I suspect also related to left ventricle hypertrophy that he probably have because of longstanding hypertension.  I will schedule him to have echocardiogram to check for left ventricle hypertrophy.  He does not have any symptoms suggesting coronary artery disease at this stage I will not do any testing targeting ischemia. 2. Essential hypertension this is first visit to my office and his blood pressure already well controlled.  We will continue present management, again echocardiogram will be done to look for left ventricle hypertrophy. 3. Morbid obesity significant problem we did spend great deal of time talking about this we did talk about weight management good diet and need to exercise on the regular basis which she started doing.  He walks 3 to 4 miles every single day he is enjoying it.  He does have a Fitbit.  He does about 8000 steps every single day I encouraged him to do even more. 4. Type 2 diabetes is a new discovery.  His hemoglobin A1c was 8.3 on 15 March.  Metformin has been initiated on dramatic changes in his life has been implemented today also staying should give her positive results.  I did calculate his 10 years risk  for coronary events it came up to 9.5.  He is already on statin which I will continue.  I also recommended to start taking 1 baby aspirin every single day he is young he never got any GI issues. 5. Dyslipidemia his LDL was 147 HDL 43, calculated 10 years risk 9.5%, he is initiated with 20 mg of Crestor will continue.  He will have fasting lipid profile done within next 6 weeks.   Medication Adjustments/Labs and Tests Ordered: Current medicines are reviewed at length with the patient today.  Concerns regarding medicines are outlined above.  No orders of the defined types were placed in this encounter.  No orders of the defined types were placed in this encounter.   Signed, Park Liter, MD, [facility]. 06/11/2020 9:24 AM    [place]

## 2020-06-11 NOTE — Progress Notes (Signed)
ekg 

## 2020-06-11 NOTE — Patient Instructions (Signed)
Medication Instructions:  Your physician recommends that you continue on your current medications as directed. Please refer to the Current Medication list given to you today.  *If you need a refill on your cardiac medications before your next appointment, please call your pharmacy*   Lab Work: None If you have labs (blood work) drawn today and your tests are completely normal, you will receive your results only by: Marland Kitchen MyChart Message (if you have MyChart) OR . A paper copy in the mail If you have any lab test that is abnormal or we need to change your treatment, we will call you to review the results.   Testing/Procedures: Your physician has requested that you have an echocardiogram. Echocardiography is a painless test that uses sound waves to create images of your heart. It provides your doctor with information about the size and shape of your heart and how well your heart's chambers and valves are working. This procedure takes approximately one hour. There are no restrictions for this procedure.   Follow-Up: At [facility], you and your health needs are our priority.  As part of our continuing mission to provide you with exceptional heart care, we have created designated Provider Care Teams.  These Care Teams include your primary Cardiologist (physician) and Advanced Practice Providers (APPs -  Physician Assistants and Nurse Practitioners) who all work together to provide you with the care you need, when you need it.  We recommend signing up for the patient portal called "MyChart".  Sign up information is provided on this After Visit Summary.  MyChart is used to connect with patients for Virtual Visits (Telemedicine).  Patients are able to view lab/test results, encounter notes, upcoming appointments, etc.  Non-urgent messages can be sent to your provider as well.   To learn more about what you can do with MyChart, go to [email or web address].    Your next appointment:   3  month(s)  The format for your next appointment:   In Person  Provider:   Jenne Campus, MD   Other Instructions  Echocardiogram An echocardiogram is a test that uses sound waves (ultrasound) to produce images of the heart. Images from an echocardiogram can provide important information about:  Heart size and shape.  The size and thickness and movement of your heart's walls.  Heart muscle function and strength.  Heart valve function or if you have stenosis. Stenosis is when the heart valves are too narrow.  If blood is flowing backward through the heart valves (regurgitation).  A tumor or infectious growth around the heart valves.  Areas of heart muscle that are not working well because of poor blood flow or injury from a heart attack.  Aneurysm detection. An aneurysm is a weak or damaged part of an artery wall. The wall bulges out from the normal force of blood pumping through the body. Tell a health care provider about:  Any allergies you have.  All medicines you are taking, including vitamins, herbs, eye drops, creams, and over-the-counter medicines.  Any blood disorders you have.  Any surgeries you have had.  Any medical conditions you have.  Whether you are pregnant or may be pregnant. What are the risks? Generally, this is a safe test. However, problems may occur, including an allergic reaction to dye (contrast) that may be used during the test. What happens before the test? No specific preparation is needed. You may eat and drink normally. What happens during the test?  You will take off your clothes from the  waist up and put on a hospital gown.  Electrodes or electrocardiogram (ECG)patches may be placed on your chest. The electrodes or patches are then connected to a device that monitors your heart rate and rhythm.  You will lie down on a table for an ultrasound exam. A gel will be applied to your chest to help sound waves pass through your skin.  A  handheld device, called a transducer, will be pressed against your chest and moved over your heart. The transducer produces sound waves that travel to your heart and bounce back (or "echo" back) to the transducer. These sound waves will be captured in real-time and changed into images of your heart that can be viewed on a video monitor. The images will be recorded on a computer and reviewed by your health care provider.  You may be asked to change positions or hold your breath for a short time. This makes it easier to get different views or better views of your heart.  In some cases, you may receive contrast through an IV in one of your veins. This can improve the quality of the pictures from your heart. The procedure may vary among health care providers and hospitals.   What can I expect after the test? You may return to your normal, everyday life, including diet, activities, and medicines, unless your health care provider tells you not to do that. Follow these instructions at home:  It is up to you to get the results of your test. Ask your health care provider, or the department that is doing the test, when your results will be ready.  Keep all follow-up visits. This is important. Summary  An echocardiogram is a test that uses sound waves (ultrasound) to produce images of the heart.  Images from an echocardiogram can provide important information about the size and shape of your heart, heart muscle function, heart valve function, and other possible heart problems.  You do not need to do anything to prepare before this test. You may eat and drink normally.  After the echocardiogram is completed, you may return to your normal, everyday life, unless your health care provider tells you not to do that. This information is not intended to replace advice given to you by your health care provider. Make sure you discuss any questions you have with your health care provider. Document Revised:  10/24/2019 Document Reviewed: 10/24/2019 Elsevier Patient Education  2021 [employer].

## 2020-06-12 LAB — HM DIABETES EYE EXAM

## 2020-06-13 ENCOUNTER — Other Ambulatory Visit: Payer: Self-pay

## 2020-06-13 ENCOUNTER — Other Ambulatory Visit (INDEPENDENT_AMBULATORY_CARE_PROVIDER_SITE_OTHER): Payer: 59

## 2020-06-13 DIAGNOSIS — E1165 Type 2 diabetes mellitus with hyperglycemia: Secondary | ICD-10-CM

## 2020-06-13 LAB — LIPID PANEL
Cholesterol: 114 mg/dL (ref 0–200)
HDL: 36 mg/dL — ABNORMAL LOW (ref >39.00)
LDL Cholesterol: 65 mg/dL (ref 0–99)
NonHDL: 78.35
Total CHOL/HDL Ratio: 3
Triglycerides: 69 mg/dL (ref 0.0–149.0)
VLDL: 13.8 mg/dL (ref 0.0–40.0)

## 2020-06-13 LAB — COMPREHENSIVE METABOLIC PANEL
ALT: 41 U/L (ref 0–53)
AST: 37 U/L (ref 0–37)
Albumin: 4.6 g/dL (ref 3.5–5.2)
Alkaline Phosphatase: 63 U/L (ref 39–117)
BUN: 25 mg/dL — ABNORMAL HIGH (ref 6–23)
CO2: 31 mEq/L (ref 19–32)
Calcium: 9.8 mg/dL (ref 8.4–10.5)
Chloride: 101 mEq/L (ref 96–112)
Creatinine, Ser: 1.26 mg/dL (ref 0.40–1.50)
GFR: 64.97 mL/min (ref >60.00)
Glucose, Bld: 100 mg/dL — ABNORMAL HIGH (ref 70–99)
Potassium: 4 mEq/L (ref 3.5–5.1)
Sodium: 140 mEq/L (ref 135–145)
Total Bilirubin: 0.7 mg/dL (ref 0.2–1.2)
Total Protein: 7.4 g/dL (ref 6.0–8.3)

## 2020-06-13 LAB — MICROALBUMIN / CREATININE URINE RATIO
Creatinine,U: 130.3 mg/dL
Microalb Creat Ratio: 1.1 mg/g (ref 0.0–30.0)
Microalb, Ur: 1.4 mg/dL (ref 0.0–1.9)

## 2020-06-18 NOTE — Progress Notes (Signed)
Chief Complaint:   OBESITY Travis Fields is here to discuss his progress with his obesity treatment plan along with follow-up of his obesity related diagnoses. Travis Fields is on the Category 4 Plan and states he is following his eating plan approximately 95-99% of the time. Travis Fields states he is walking 2-4 miles 7 times per week.  Today's visit was #: 2 Starting weight: 303 lbs Starting date: 05/28/2020 Today's weight: 294 lbs Today's date: 06/11/2020 Total lbs lost to date: 9 lbs Total lbs lost since last in-office visit: 9  Interim History: Travis Fields finds the meal plan interesting. He wants to know if there are any changes to the meal plan. Travis Fields doesn't like yogurt, cottage cheese, or cold cheese. He enjoys meat. He is starting to tire of eggs. Travis Fields is realizing his portions were increased in carbohydrates previously. He does want to continue current plan.  Subjective:   1. Dyslipidemia (high LDL; low HDL) Tsutomu is on Crestor 20 mg. He denies myalgias. His total cholesterol 208, LDL 147, and HDL 43. He has a 10 year risk score of 9.5%.  2. Type 2 diabetes mellitus with hyperglycemia, without long-term current use of insulin ([place]) New. Discussed labs with patient today. Travis Fields is on Metformin currently. He is testing blood sugar a few times a week. Discussed GLP-1 therapy with Travis Fields today.   3. Transaminitis Travis Fields's LFT's are slightly elevated. 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  4. Vitamin D deficiency Travis Fields has a Vit D level of 30.1. He reports fatigue but is not on Vit D supplementation.  5. At risk for osteoporosis Travis Fields is at higher risk of osteopenia and osteoporosis due to Vitamin D deficiency.   Assessment/Plan:   1. Dyslipidemia (high LDL; low HDL) Cardiovascular risk and specific lipid/LDL goals reviewed.  We discussed several lifestyle modifications today and [place] will continue to work on diet, exercise and weight loss efforts. Orders and follow up as documented in patient record. Continue  Crestor with no change in dose.  Counseling Intensive lifestyle modifications are the first line treatment for this issue. . Dietary changes: Increase soluble fiber. Decrease simple carbohydrates. . Exercise changes: Moderate to vigorous-intensity aerobic activity 150 minutes per week if tolerated. . Lipid-lowering medications: see documented in medical record.  2. Type 2 diabetes mellitus with hyperglycemia, without long-term current use of insulin (HCC) Good blood sugar control is important to decrease the likelihood of diabetic complications such as nephropathy, neuropathy, limb loss, blindness, coronary artery disease, and death. Intensive lifestyle modification including diet, exercise and weight loss are the first line of treatment for diabetes. Follow up on GLP-1 at next appointment.  3. Transaminitis Repeat labs in 3 months.  4. Vitamin D deficiency Low Vitamin D level contributes to fatigue and are associated with obesity, breast, and colon cancer. He agrees to start to take prescription Vitamin D @50 ,000 IU every week and will follow-up for routine testing of Vitamin D, at least 2-3 times per year to avoid over-replacement.  - Vitamin D, Ergocalciferol, (DRISDOL) 1.25 MG (50000 UNIT) CAPS capsule; Take 1 capsule (50,000 Units total) by mouth every 7 (seven) days.  Dispense: 4 capsule; Refill: 0  5. At risk for osteoporosis [place] was given approximately 30 minutes of osteoporosis prevention counseling today. Travis Fields is at risk for osteopenia and osteoporosis due to his Vitamin D deficiency. He was encouraged to take his Vitamin D and follow his higher calcium diet and increase strengthening exercise to help strengthen his bones and decrease his risk of osteopenia  and osteoporosis.  Repetitive spaced learning was employed today to elicit superior memory formation and behavioral change.  6. Class 3 severe obesity with serious comorbidity and body mass index (BMI) of 40.0 to 44.9 in  adult, unspecified obesity type [facility]) Travis Fields is currently in the action stage of change. As such, his goal is to continue with weight loss efforts. He has agreed to the Category 4 Plan.   Exercise goals: All adults should avoid inactivity. Some physical activity is better than none, and adults who participate in any amount of physical activity gain some health benefits.  Behavioral modification strategies: increasing lean protein intake, meal planning and cooking strategies and keeping healthy foods in the home.  Travis Fields has agreed to follow-up with our clinic in 2 weeks. He was informed of the importance of frequent follow-up visits to maximize his success with intensive lifestyle modifications for his multiple health conditions.   Objective:   Blood pressure 119/74, pulse 65, temperature 98.1 F (36.7 C), temperature source Oral, height 5\' 9"  (1.753 m), weight 294 lb (133.4 kg), SpO2 96 %. Body mass index is 43.42 kg/m.  General: Cooperative, alert, well developed, in no acute distress. HEENT: Conjunctivae and lids unremarkable. Cardiovascular: Regular rhythm.  Lungs: Normal work of breathing. Neurologic: No focal deficits.   Lab Results  Component Value Date   CREATININE 1.26 06/13/2020   BUN 25 (H) 06/13/2020   NA 140 06/13/2020   K 4.0 06/13/2020   CL 101 06/13/2020   CO2 31 06/13/2020   Lab Results  Component Value Date   ALT 41 06/13/2020   AST 37 06/13/2020   ALKPHOS 63 06/13/2020   BILITOT 0.7 06/13/2020   Lab Results  Component Value Date   HGBA1C 8.3 (H) 05/28/2020   HGBA1C 6.2 10/25/2017   HGBA1C 5.9 12/05/2015   Lab Results  Component Value Date   INSULIN 46.3 (H) 05/28/2020   Lab Results  Component Value Date   TSH 2.240 05/28/2020   Lab Results  Component Value Date   CHOL 114 06/13/2020   HDL 36.00 (L) 06/13/2020   LDLCALC 65 06/13/2020   TRIG 69.0 06/13/2020   CHOLHDL 3 06/13/2020   Lab Results  Component Value Date   WBC 6.9 05/28/2020    HGB 16.5 05/28/2020   HCT 49.8 05/28/2020   MCV 87 05/28/2020   PLT 152 05/28/2020    Attestation Statements:   Reviewed by clinician on day of visit: allergies, medications, problem list, medical history, surgical history, family history, social history, and previous encounter notes.   Coral Ceo, am acting as transcriptionist for Coralie Common, MD.   I have reviewed the above documentation for accuracy and completeness, and I agree with the above. - Jinny Blossom, MD

## 2020-06-21 ENCOUNTER — Telehealth (INDEPENDENT_AMBULATORY_CARE_PROVIDER_SITE_OTHER): Payer: 59 | Admitting: Family Medicine

## 2020-06-21 ENCOUNTER — Other Ambulatory Visit: Payer: Self-pay

## 2020-06-21 ENCOUNTER — Encounter: Payer: Self-pay | Admitting: Family Medicine

## 2020-06-21 DIAGNOSIS — E1165 Type 2 diabetes mellitus with hyperglycemia: Secondary | ICD-10-CM | POA: Diagnosis not present

## 2020-06-21 MED ORDER — OZEMPIC (0.25 OR 0.5 MG/DOSE) 2 MG/1.5ML ~~LOC~~ SOPN: PEN_INJECTOR | SUBCUTANEOUS | 1 refills | Status: DC

## 2020-06-21 NOTE — Progress Notes (Signed)
Chief Complaint  Patient presents with  . Medication Problem    Subjective: Patient is a 55 y.o. male here for medication discussion. Due to COVID-19 pandemic, we are interacting via web portal for an electronic face-to-face visit. I verified patient's ID using 2 identifiers. Patient agreed to proceed with visit via this method. Patient is at home, I am at office. Patient and I are present for visit.   Patient was recently diagnosed with diabetes.  A1c 8.3.  He was started on Metformin at 500 mg daily and is scheduled to increase weekly until he is taking 1000 mg twice daily.  He has some abdominal cramping initially but that resolved.  He is currently taking 500 mg twice daily without adverse effects.  He reports compliance.  He is losing weight thinks in part to the healthy weight and wellness clinic.  Diet is improving.  He is walking.  No chest pain or shortness of breath.  His physician at the weight loss clinic wants him to stay at a lower dose of Metformin and add a weekly GLP-1.  He wants to know my opinion of this.   Past Medical History:  Diagnosis Date  . Allergy   . Asthma   . Back pain   . Bilateral swelling of feet   . Chronic kidney disease    kidney stones   . Essential hypertension 03/29/2017  . GERD (gastroesophageal reflux disease)    dx'd with GERD 10- 12 yrs ago with EGD   . Gout   . Heartburn   . High cholesterol   . History of chicken pox   . History of kidney stones   . History of shingles   . Hypertension   . Joint pain   . Morbid obesity ([place]) 12/05/2015  . OSA on CPAP 12/05/2015  . Right foot pain 06/07/2017  . Seasonal allergies   . Sleep apnea    wears cpap   . SOB (shortness of breath)   . Swallowing difficulty   . Type 2 diabetes mellitus with hyperglycemia, without long-term current use of insulin ([place]) 05/29/2020    Objective: No conversational dyspnea Age appropriate judgment and insight Nml affect and mood  Assessment and Plan: Type 2  diabetes mellitus with hyperglycemia, without long-term current use of insulin ([place])  Morbid obesity ([place])  1.  Chronic, uncontrolled.  Continue Metformin until he is taking 1000 mg twice daily for at least a week.  If no side effects, will add Ozempic 0.25 mg weekly for 4 weeks and then increase to 0.5 mg weekly.  He will let me know if there are any cost issues. 2.  Counseled on diet and exercise.  Appreciate healthy weight and wellness input.  Ozempic as above. Follow-up as originally scheduled. The patient voiced understanding and agreement to the plan.  [place], DO 06/21/20  8:25 AM

## 2020-07-01 ENCOUNTER — Encounter (INDEPENDENT_AMBULATORY_CARE_PROVIDER_SITE_OTHER): Payer: Self-pay

## 2020-07-02 ENCOUNTER — Ambulatory Visit (INDEPENDENT_AMBULATORY_CARE_PROVIDER_SITE_OTHER): Payer: 59 | Admitting: Family Medicine

## 2020-07-03 ENCOUNTER — Encounter (INDEPENDENT_AMBULATORY_CARE_PROVIDER_SITE_OTHER): Payer: Self-pay | Admitting: Family Medicine

## 2020-07-03 ENCOUNTER — Ambulatory Visit (INDEPENDENT_AMBULATORY_CARE_PROVIDER_SITE_OTHER): Payer: 59 | Admitting: Family Medicine

## 2020-07-03 ENCOUNTER — Other Ambulatory Visit: Payer: Self-pay

## 2020-07-03 VITALS — BP 116/73 | HR 64 | Temp 98.3°F | Ht 69.0 in | Wt 281.0 lb

## 2020-07-03 DIAGNOSIS — E1165 Type 2 diabetes mellitus with hyperglycemia: Secondary | ICD-10-CM

## 2020-07-03 DIAGNOSIS — E1159 Type 2 diabetes mellitus with other circulatory complications: Secondary | ICD-10-CM

## 2020-07-03 DIAGNOSIS — Z6841 Body Mass Index (BMI) 40.0 and over, adult: Secondary | ICD-10-CM

## 2020-07-03 DIAGNOSIS — I152 Hypertension secondary to endocrine disorders: Secondary | ICD-10-CM

## 2020-07-09 ENCOUNTER — Other Ambulatory Visit (INDEPENDENT_AMBULATORY_CARE_PROVIDER_SITE_OTHER): Payer: Self-pay | Admitting: Family Medicine

## 2020-07-09 DIAGNOSIS — E559 Vitamin D deficiency, unspecified: Secondary | ICD-10-CM

## 2020-07-10 NOTE — Progress Notes (Signed)
Chief Complaint:   OBESITY Travis Fields is here to discuss his progress with his obesity treatment plan along with follow-up of his obesity related diagnoses. Travis Fields is on the Category 4 Plan and states he is following his eating plan approximately 95% of the time. Travis Fields states he is walking 60 minutes 5 times per week.  Today's visit was #: 3 Starting weight: 303 lbs Starting date: 05/28/2020 Today's weight: 281 lbs Today's date: 07/03/2020 Total lbs lost to date: 22 Total lbs lost since last in-office visit: 9  Interim History: Travis Fields voices that every so often, following plan is difficult in terms of cooking dinner every evening and eating everything on plan. Minimal hunger, except emotional hunger of increased desire to eat after lunch. Pt is traveling over the next few weeks to see his parents for 4 days for his father's 83[age] birthday.  Subjective:   1. Type 2 diabetes mellitus with hyperglycemia, without long-term current use of insulin (HCC) Travis Fields got Ozempic Rx from Dr. Nani Ravens but hasn't started it, as he doesn't feel he needs it.  2. Hypertension associated with diabetes ([place]) Travis Fields is on Benicar and Hygroton. His BP is well controlled. Pt denies chest pain, chest pressure and headache.  Assessment/Plan:   1. Type 2 diabetes mellitus with hyperglycemia, without long-term current use of insulin (HCC) Good blood sugar control is important to decrease the likelihood of diabetic complications such as nephropathy, neuropathy, limb loss, blindness, coronary artery disease, and death. Intensive lifestyle modification including diet, exercise and weight loss are the first line of treatment for diabetes. Continue category 4 or journaling 2100-2200 calories.  2. Hypertension associated with diabetes ([place]) Travis Fields is working on healthy weight loss and exercise to improve blood pressure control. We will watch for signs of hypotension as he continues his lifestyle modifications. Decrease  hygroton at next appointment if BP is still controlled.  3. Class 3 severe obesity with serious comorbidity and body mass index (BMI) of 45.0 to 49.9 in adult, unspecified obesity type [facility]) Travis Fields is currently in the action stage of change. As such, his goal is to continue with weight loss efforts. He has agreed to the Category 4 Plan and keeping a food journal and adhering to recommended goals of 2100-2200 calories and 130+ grams protein.   Exercise goals: No exercise has been prescribed at this time.  Behavioral modification strategies: increasing lean protein intake, meal planning and cooking strategies, keeping healthy foods in the home and planning for success.  Travis Fields has agreed to follow-up with our clinic in 2-3 weeks. He was informed of the importance of frequent follow-up visits to maximize his success with intensive lifestyle modifications for his multiple health conditions.   Objective:   Blood pressure 116/73, pulse 64, temperature 98.3 F (36.8 C), height 5\' 9"  (1.753 m), weight 281 lb (127.5 kg), SpO2 98 %. Body mass index is 41.5 kg/m.  General: Cooperative, alert, well developed, in no acute distress. HEENT: Conjunctivae and lids unremarkable. Cardiovascular: Regular rhythm.  Lungs: Normal work of breathing. Neurologic: No focal deficits.   Lab Results  Component Value Date   CREATININE 1.26 06/13/2020   BUN 25 (H) 06/13/2020   NA 140 06/13/2020   K 4.0 06/13/2020   CL 101 06/13/2020   CO2 31 06/13/2020   Lab Results  Component Value Date   ALT 41 06/13/2020   AST 37 06/13/2020   ALKPHOS 63 06/13/2020   BILITOT 0.7 06/13/2020   Lab Results  Component Value Date  HGBA1C 8.3 (H) 05/28/2020   HGBA1C 6.2 10/25/2017   HGBA1C 5.9 12/05/2015   Lab Results  Component Value Date   INSULIN 46.3 (H) 05/28/2020   Lab Results  Component Value Date   TSH 2.240 05/28/2020   Lab Results  Component Value Date   CHOL 114 06/13/2020   HDL 36.00 (L) 06/13/2020    LDLCALC 65 06/13/2020   TRIG 69.0 06/13/2020   CHOLHDL 3 06/13/2020   Lab Results  Component Value Date   WBC 6.9 05/28/2020   HGB 16.5 05/28/2020   HCT 49.8 05/28/2020   MCV 87 05/28/2020   PLT 152 05/28/2020    Attestation Statements:   Reviewed by clinician on day of visit: allergies, medications, problem list, medical history, surgical history, family history, social history, and previous encounter notes.  Time spent on visit including pre-visit chart review and post-visit care and charting was 15 minutes.   Coral Ceo, am acting as transcriptionist for Coralie Common, MD.   I have reviewed the above documentation for accuracy and completeness, and I agree with the above. - Jinny Blossom, MD

## 2020-07-10 NOTE — Telephone Encounter (Signed)
Dr.Ukleja 

## 2020-07-17 ENCOUNTER — Encounter (INDEPENDENT_AMBULATORY_CARE_PROVIDER_SITE_OTHER): Payer: Self-pay | Admitting: Family Medicine

## 2020-07-19 ENCOUNTER — Ambulatory Visit (HOSPITAL_BASED_OUTPATIENT_CLINIC_OR_DEPARTMENT_OTHER)
Admission: RE | Admit: 2020-07-19 | Discharge: 2020-07-19 | Disposition: A | Discharge status: Home / Self Care | Payer: 59 | Source: Ambulatory Visit | Attending: Cardiology | Admitting: Cardiology

## 2020-07-19 ENCOUNTER — Ambulatory Visit (INDEPENDENT_AMBULATORY_CARE_PROVIDER_SITE_OTHER): Payer: 59 | Admitting: Family Medicine

## 2020-07-19 ENCOUNTER — Other Ambulatory Visit: Payer: Self-pay

## 2020-07-19 ENCOUNTER — Encounter: Payer: Self-pay | Admitting: Family Medicine

## 2020-07-19 VITALS — BP 120/80 | HR 81 | Temp 98.2°F | Ht 69.0 in | Wt 277.0 lb

## 2020-07-19 DIAGNOSIS — M25572 Pain in left ankle and joints of left foot: Secondary | ICD-10-CM

## 2020-07-19 DIAGNOSIS — M25571 Pain in right ankle and joints of right foot: Secondary | ICD-10-CM

## 2020-07-19 DIAGNOSIS — R9431 Abnormal electrocardiogram [ECG] [EKG]: Secondary | ICD-10-CM | POA: Diagnosis not present

## 2020-07-19 DIAGNOSIS — I10 Essential (primary) hypertension: Secondary | ICD-10-CM | POA: Insufficient documentation

## 2020-07-19 MED ORDER — PREDNISONE 20 MG PO TABS: 40.0000 mg | ORAL_TABLET | Freq: Every day | ORAL | 0 refills | Status: AC

## 2020-07-19 NOTE — Patient Instructions (Addendum)
Ice/cold pack over area for 10-15 min twice daily.  OK to take Tylenol 1000 mg (2 extra strength tabs) or 975 mg (3 regular strength tabs) every 6 hours as needed.  Elevate your legs.   Let us know if you need anything.  Ankle Exercises It is normal to feel mild stretching, pulling, tightness, or discomfort as you do these exercises, but you should stop right away if you feel sudden pain or your pain gets worse.  Stretching and range of motion exercises These exercises warm up your muscles and joints and improve the movement and flexibility of your ankle. These exercises also help to relieve pain, numbness, and tingling. Exercise A: Dorsiflexion/Plantar Flexion   1. Sit with your affected knee straight or bent. Do not rest your foot on anything. 2. Flex your affected ankle to tilt the top of your foot toward your shin. 3. Hold this position for 5 seconds. 4. Point your toes downward to tilt the top of your foot away from your shin. 5. Hold this position for 5 seconds. Repeat 2 times. Complete this exercise 3 times per week. Exercise B: Ankle Alphabet   1. Sit with your affected foot supported at your lower leg. ? Do not rest your foot on anything. ? Make sure your foot has room to move freely. 2. Think of your affected foot as a paintbrush, and move your foot to trace each letter of the alphabet in the air. Keep your hip and knee still while you trace. Make the letters as large as you can without increasing any discomfort. 3. Trace every letter from A to Z. Repeat 2 times. Complete this exercise 3 times per week. Strengthening exercises These exercises build strength and endurance in your ankle. Endurance is the ability to use your muscles for a long time, even after they get tired. Exercise D: Dorsiflexors   1. Secure a rubber exercise band or tube to an object, such as a table leg, that will stay still when the band is pulled. Secure the other end around your affected  foot. 2. Sit on the floor, facing the object with your affected leg extended. The band or tube should be slightly tense when your foot is relaxed. 3. Slowly flex your affected ankle and toes to bring your foot toward you. 4. Hold this position for 3 seconds.  5. Slowly return your foot to the starting position, controlling the band as you do that. Do a total of 10 repetitions. Repeat 2 times. Complete this exercise 3 times per week. Exercise E: Plantar Flexors   1. Sit on the floor with your affected leg extended. 2. Loop a rubber exercise band or tube around the ball of your affected foot. The ball of your foot is on the walking surface, right under your toes. The band or tube should be slightly tense when your foot is relaxed. 3. Slowly point your toes downward, pushing them away from you. 4. Hold this position for 3 seconds. 5. Slowly release the tension in the band or tube, controlling smoothly until your foot is back in the starting position. Repeat for a total of 10 repetitions. Repeat 2 times. Complete this exercise 3 times per week. Exercise F: Towel Curls   1. Sit in a chair on a non-carpeted surface, and put your feet on the floor. 2. Place a towel in front of your feet.  3. Keeping your heel on the floor, put your affected foot on the towel. 4. Pull the towel toward  you by grabbing the towel with your toes and curling them under. Keep your heel on the floor. 5. Let your toes relax. 6. Grab the towel again. Keep going until the towel is completely underneath your foot. Repeat for a total of 10 repetitions. Repeat 2 times. Complete this exercise 3 times per week. Exercise G: Heel Raise ( Plantar Flexors, Standing)    1. Stand with your feet shoulder-width apart. 2. Keep your weight spread evenly over the width of your feet while you rise up on your toes. Use a wall or table to steady yourself, but try not to use it for support. 3. If this exercise is too easy, try these  options: ? Shift your weight toward your affected leg until you feel challenged. ? If told by your health care provider, lift your uninjured leg off the floor. 4. Hold this position for 3 seconds. Repeat for a total of 10 repetitions. Repeat 2 times. Complete this exercise 3 times per week. Exercise H: Tandem Walking 1. Stand with one foot directly in front of the other. 2. Slowly raise your back foot up, lifting your heel before your toes, and place it directly in front of your other foot. 3. Continue to walk in this heel-to-toe way for 10 steps or for as long as told by your health care provider. Have a countertop or wall nearby to use if needed to keep your balance, but try not to hold onto anything for support. Repeat 2 times. Complete this exercises 3 times per week. Make sure you discuss any questions you have with your health care provider. Document Released: 01/14/2005 Document Revised: 10/31/2015 Document Reviewed: 11/18/2014 Elsevier Interactive Patient Education  2018 [employer].

## 2020-07-19 NOTE — Progress Notes (Signed)
Musculoskeletal Exam  Patient: Travis Fields DOB: 07/28/1965  DOS: 07/19/2020  SUBJECTIVE:  Chief Complaint:   Chief Complaint  Patient presents with  . Ankle Pain    Travis Fields is a 55 y.o.  male for evaluation and treatment of bilateral ankle pain.   Onset:  6 days ago. Slipped and rolled ankle.   Location: Anterior ankles Character:  aching and sharp  Progression of issue:  is unchanged Associated symptoms: swelling, decreased ROM; no redness, bruising Treatment: to date has been ice and OTC NSAIDS.   Neurovascular symptoms: no  Past Medical History:  Diagnosis Date  . Allergy   . Asthma   . Back pain   . Bilateral swelling of feet   . Chronic kidney disease    kidney stones   . Essential hypertension 03/29/2017  . GERD (gastroesophageal reflux disease)    dx'd with GERD 10- 12 yrs ago with EGD   . Gout   . Heartburn   . High cholesterol   . History of chicken pox   . History of kidney stones   . History of shingles   . Hypertension   . Joint pain   . Morbid obesity ([place]) 12/05/2015  . OSA on CPAP 12/05/2015  . Right foot pain 06/07/2017  . Seasonal allergies   . Sleep apnea    wears cpap   . SOB (shortness of breath)   . Swallowing difficulty   . Type 2 diabetes mellitus with hyperglycemia, without long-term current use of insulin ([place]) 05/29/2020    Objective: VITAL SIGNS: BP 120/80 (BP Location: Left Arm, Patient Position: Sitting, Cuff Size: Large)   Pulse 81   Temp 98.2 F (36.8 C) (Oral)   Ht 5\' 9"  (1.753 m)   Wt 277 lb (125.6 kg)   SpO2 97%   BMI 40.91 kg/m  Constitutional: Well formed, well developed. No acute distress. Thorax & Lungs: No accessory muscle use Musculoskeletal: ankles.   Normal active range of motion: decreased.   Normal passive range of motion: yes Tenderness to palpation: Yes, tender to palpation over the ATFL bilaterally; no bony tenderness, no tenderness over the proximal fibular head Deformity: no Soft tissue  swelling noted bilaterally, worse in the left Ecchymosis: no Tests positive: None Tests negative: Anterior drawer bilaterally Neurologic: Normal sensory function.  Gait is antalgic. Psychiatric: Normal mood. Age appropriate judgment and insight. Alert & oriented x 3.    Assessment:  Acute bilateral ankle pain - Plan: predniSONE (DELTASONE) 20 MG tablet  Plan: Stretches/exercises, ice, Tylenol.  He does have a history of gout.  We will do a 5-day prednisone course, 40 mg daily.  NSAIDs after that. F/u prn. The patient voiced understanding and agreement to the plan.   [place], DO 07/19/20  11:41 AM

## 2020-07-22 LAB — ECHOCARDIOGRAM COMPLETE
AR max vel: 3.4 cm2
AV Area VTI: 3.35 cm2
AV Area mean vel: 2.88 cm2
AV Mean grad: 3 mmHg
AV Peak grad: 7 mmHg
Ao pk vel: 1.32 m/s
Area-P 1/2: 4.57 cm2
Calc EF: 74.8 %
S' Lateral: 3.22 cm
Single Plane A2C EF: 70.9 %
Single Plane A4C EF: 78.1 %

## 2020-07-29 ENCOUNTER — Other Ambulatory Visit: Payer: Self-pay

## 2020-07-29 ENCOUNTER — Encounter (INDEPENDENT_AMBULATORY_CARE_PROVIDER_SITE_OTHER): Payer: Self-pay | Admitting: Family Medicine

## 2020-07-29 ENCOUNTER — Ambulatory Visit (INDEPENDENT_AMBULATORY_CARE_PROVIDER_SITE_OTHER): Payer: 59 | Admitting: Family Medicine

## 2020-07-29 VITALS — BP 108/64 | HR 60 | Temp 97.7°F | Ht 69.0 in | Wt 267.0 lb

## 2020-07-29 DIAGNOSIS — Z9189 Other specified personal risk factors, not elsewhere classified: Secondary | ICD-10-CM | POA: Diagnosis not present

## 2020-07-29 DIAGNOSIS — E1159 Type 2 diabetes mellitus with other circulatory complications: Secondary | ICD-10-CM

## 2020-07-29 DIAGNOSIS — Z6841 Body Mass Index (BMI) 40.0 and over, adult: Secondary | ICD-10-CM

## 2020-07-29 DIAGNOSIS — E1165 Type 2 diabetes mellitus with hyperglycemia: Secondary | ICD-10-CM

## 2020-07-29 DIAGNOSIS — E559 Vitamin D deficiency, unspecified: Secondary | ICD-10-CM

## 2020-07-29 DIAGNOSIS — I152 Hypertension secondary to endocrine disorders: Secondary | ICD-10-CM

## 2020-07-29 MED ORDER — VITAMIN D (ERGOCALCIFEROL) 1.25 MG (50000 UNIT) PO CAPS: 50000.0000 [IU] | ORAL_CAPSULE | ORAL | 0 refills | Status: DC

## 2020-07-29 MED ORDER — OLMESARTAN MEDOXOMIL 20 MG PO TABS: 10.0000 mg | ORAL_TABLET | Freq: Every day | ORAL | 2 refills | Status: DC

## 2020-07-30 NOTE — Progress Notes (Signed)
Chief Complaint:   OBESITY Travis Fields is here to discuss his progress with his obesity treatment plan along with follow-up of his obesity related diagnoses. Travis Fields is on the Category 4 Plan and keeping a food journal and adhering to recommended goals of 2100-2200 calories and 130+ g protein and states he is following his eating plan approximately 90% of the time. Travis Fields states he is walking 2-3 miles 5 times per week.  Today's visit was #: 4 Starting weight: 303 lbs Starting date: 05/28/2020 Today's weight: 267 lbs Today's date: 07/29/2020 Total lbs lost to date: 36 lbs Total lbs lost since last in-office visit: 14  Interim History: Travis Fields voices he had a challenging week with getting ill, spraining one ankle, and having a gout attack in the other ankle. He is following plan fairly strictly. Not sure what he is doing for [facility] and for his birthday. He is hoping to get his [place] out.  Subjective:   1. Vitamin D deficiency Travis Fields denies nausea, vomiting, and muscle weakness but notes fatigue. Pt is on prescription Vit D.  2. Hypertension associated with diabetes ([place]) Travis Fields is on Benicar and Hygroton. Pt denies chest pain/chest pressure/headache. BP is low today.  BP Readings from Last 3 Encounters:  07/29/20 108/64  07/19/20 120/80  07/03/20 116/73   3. At risk for osteoporosis Travis Fields is at higher risk of osteopenia and osteoporosis due to Vitamin D deficiency.   Assessment/Plan:   1. Vitamin D deficiency Low Vitamin D level contributes to fatigue and are associated with obesity, breast, and colon cancer. He agrees to continue to take prescription Vitamin D @50 ,000 IU every week and will follow-up for routine testing of Vitamin D, at least 2-3 times per year to avoid over-replacement. - Vitamin D, Ergocalciferol, (DRISDOL) 1.25 MG (50000 UNIT) CAPS capsule; Take 1 capsule (50,000 Units total) by mouth every 7 (seven) days.  Dispense: 4 capsule; Refill: 0  2. Hypertension  associated with diabetes ([place]) Travis Fields is working on healthy weight loss and exercise to improve blood pressure control. We will watch for signs of hypotension as he continues his lifestyle modifications.  -Decrease Benicar to 10 mg (pt is to cut pill in 1/2). -Follow up BP at next appt - olmesartan (BENICAR) 20 MG tablet; Take 0.5 tablets (10 mg total) by mouth daily.  Dispense: 90 tablet; Refill: 2  3. At risk for osteoporosis Travis Fields was given approximately 15 minutes of osteoporosis prevention counseling today. Travis Fields is at risk for osteopenia and osteoporosis due to his Vitamin D deficiency. He was encouraged to take his Vitamin D and follow his higher calcium diet and increase strengthening exercise to help strengthen his bones and decrease his risk of osteopenia and osteoporosis.  Repetitive spaced learning was employed today to elicit superior memory formation and behavioral change.  4. Class 3 severe obesity with serious comorbidity and body mass index (BMI) of 45.0 to 49.9 in adult, unspecified obesity type Travis Fields)  Travis Fields is currently in the action stage of change. As such, his goal is to continue with weight loss efforts. He has agreed to the Category 4 Plan.   Exercise goals: As is- Pt is to add 10-15 minutes of resistance training 2-3 times a week.  Behavioral modification strategies: increasing lean protein intake, meal planning and cooking strategies, keeping healthy foods in the home and planning for success.  Travis Fields has agreed to follow-up with our clinic in 2-3 weeks. He was informed of the importance of frequent follow-up visits  to maximize his success with intensive lifestyle modifications for his multiple health conditions.   Objective:   Blood pressure 108/64, pulse 60, temperature 97.7 F (36.5 C), height 5\' 9"  (1.753 m), weight 267 lb (121.1 kg), SpO2 97 %. Body mass index is 39.43 kg/m.  General: Cooperative, alert, well developed, in no acute distress. HEENT:  Conjunctivae and lids unremarkable. Cardiovascular: Regular rhythm.  Lungs: Normal work of breathing. Neurologic: No focal deficits.   Lab Results  Component Value Date   CREATININE 1.26 06/13/2020   BUN 25 (H) 06/13/2020   NA 140 06/13/2020   K 4.0 06/13/2020   CL 101 06/13/2020   CO2 31 06/13/2020   Lab Results  Component Value Date   ALT 41 06/13/2020   AST 37 06/13/2020   ALKPHOS 63 06/13/2020   BILITOT 0.7 06/13/2020   Lab Results  Component Value Date   HGBA1C 8.3 (H) 05/28/2020   HGBA1C 6.2 10/25/2017   HGBA1C 5.9 12/05/2015   Lab Results  Component Value Date   INSULIN 46.3 (H) 05/28/2020   Lab Results  Component Value Date   TSH 2.240 05/28/2020   Lab Results  Component Value Date   CHOL 114 06/13/2020   HDL 36.00 (L) 06/13/2020   LDLCALC 65 06/13/2020   TRIG 69.0 06/13/2020   CHOLHDL 3 06/13/2020   Lab Results  Component Value Date   WBC 6.9 05/28/2020   HGB 16.5 05/28/2020   HCT 49.8 05/28/2020   MCV 87 05/28/2020   PLT 152 05/28/2020   No results found for: IRON, TIBC, FERRITIN   Attestation Statements:   Reviewed by clinician on day of visit: allergies, medications, problem list, medical history, surgical history, family history, social history, and previous encounter notes.  Coral Ceo, CMA, am acting as transcriptionist for Coralie Common, MD.   I have reviewed the above documentation for accuracy and completeness, and I agree with the above. - Jinny Blossom, MD

## 2020-08-01 ENCOUNTER — Other Ambulatory Visit: Payer: Self-pay | Admitting: Family Medicine

## 2020-08-01 DIAGNOSIS — E1165 Type 2 diabetes mellitus with hyperglycemia: Secondary | ICD-10-CM

## 2020-08-07 ENCOUNTER — Other Ambulatory Visit: Payer: Self-pay | Admitting: Family Medicine

## 2020-08-09 ENCOUNTER — Encounter: Payer: Self-pay | Admitting: Emergency Medicine

## 2020-08-22 ENCOUNTER — Telehealth: Payer: Self-pay | Admitting: Cardiology

## 2020-08-22 NOTE — Telephone Encounter (Signed)
Spoke with patient about echo results,  normal LVEF, overall looks good.  Patient appreciative of call.

## 2020-08-22 NOTE — Telephone Encounter (Signed)
Patient is calling to discuss echo results.

## 2020-08-24 ENCOUNTER — Encounter (INDEPENDENT_AMBULATORY_CARE_PROVIDER_SITE_OTHER): Payer: Self-pay

## 2020-08-26 ENCOUNTER — Ambulatory Visit (INDEPENDENT_AMBULATORY_CARE_PROVIDER_SITE_OTHER): Payer: 59 | Admitting: Family Medicine

## 2020-08-26 ENCOUNTER — Other Ambulatory Visit: Payer: Self-pay

## 2020-08-26 ENCOUNTER — Other Ambulatory Visit: Payer: Self-pay | Admitting: Family Medicine

## 2020-08-26 ENCOUNTER — Encounter: Payer: Self-pay | Admitting: Family Medicine

## 2020-08-26 VITALS — BP 130/86 | HR 78 | Temp 98.2°F | Ht 69.0 in | Wt 269.4 lb

## 2020-08-26 DIAGNOSIS — E1165 Type 2 diabetes mellitus with hyperglycemia: Secondary | ICD-10-CM

## 2020-08-26 LAB — COMPREHENSIVE METABOLIC PANEL
ALT: 23 U/L (ref 0–53)
AST: 23 U/L (ref 0–37)
Albumin: 4.4 g/dL (ref 3.5–5.2)
Alkaline Phosphatase: 54 U/L (ref 39–117)
BUN: 17 mg/dL (ref 6–23)
CO2: 27 mEq/L (ref 19–32)
Calcium: 9.9 mg/dL (ref 8.4–10.5)
Chloride: 106 mEq/L (ref 96–112)
Creatinine, Ser: 1.15 mg/dL (ref 0.40–1.50)
GFR: 72.39 mL/min (ref >60.00)
Glucose, Bld: 86 mg/dL (ref 70–99)
Potassium: 4.2 mEq/L (ref 3.5–5.1)
Sodium: 143 mEq/L (ref 135–145)
Total Bilirubin: 0.9 mg/dL (ref 0.2–1.2)
Total Protein: 7 g/dL (ref 6.0–8.3)

## 2020-08-26 LAB — LIPID PANEL
Cholesterol: 104 mg/dL (ref 0–200)
HDL: 35.9 mg/dL — ABNORMAL LOW (ref >39.00)
LDL Cholesterol: 56 mg/dL (ref 0–99)
NonHDL: 67.69
Total CHOL/HDL Ratio: 3
Triglycerides: 59 mg/dL (ref 0.0–149.0)
VLDL: 11.8 mg/dL (ref 0.0–40.0)

## 2020-08-26 LAB — HEMOGLOBIN A1C: Hgb A1c MFr Bld: 6.1 % (ref 4.6–6.5)

## 2020-08-26 MED ORDER — METFORMIN HCL 500 MG PO TABS: 500.0000 mg | ORAL_TABLET | Freq: Two times a day (BID) | ORAL | 2 refills | Status: DC

## 2020-08-26 NOTE — Patient Instructions (Signed)
Give us 2-3 business days to get the results of your labs back.   Keep the diet clean and stay active.  Let us know if you need anything. 

## 2020-08-26 NOTE — Progress Notes (Signed)
Subjective:   Chief Complaint  Patient presents with   Follow-up    Travis Fields is a 55 y.o. male here for follow-up of diabetes.   Travis Fields's self monitored glucose range is low 100's. Patient does not require insulin.   Medications include: Metformin 1000 mg bid Diet is healthy.  Exercise: walking No CP or SOB Last A1c was 8.2 in March 2022.   Past Medical History:  Diagnosis Date   Allergy    Asthma    Back pain    Bilateral swelling of feet    Chronic kidney disease    kidney stones    Essential hypertension 03/29/2017   GERD (gastroesophageal reflux disease)    dx'd with GERD 10- 12 yrs ago with EGD    Gout    Heartburn    High cholesterol    History of chicken pox    History of kidney stones    History of shingles    Hypertension    Joint pain    Morbid obesity ([place]) 12/05/2015   OSA on CPAP 12/05/2015   Right foot pain 06/07/2017   Seasonal allergies    Sleep apnea    wears cpap    SOB (shortness of breath)    Swallowing difficulty    Type 2 diabetes mellitus with hyperglycemia, without long-term current use of insulin ([place]) 05/29/2020     Related testing: Retinal exam: Don Pneumovax: done  Objective:  BP 130/86 (BP Location: Left Arm, Patient Position: Sitting, Cuff Size: Large)   Pulse 78   Temp 98.2 F (36.8 C) (Oral)   Ht 5\' 9"  (1.753 m)   Wt 269 lb 6 oz (122.2 kg)   SpO2 99%   BMI 39.78 kg/m  General:  Well developed, well nourished, in no apparent distress Skin:  Warm, no pallor or diaphoresis Head:  Normocephalic, atraumatic Eyes:  Pupils equal and round, sclera anicteric without injection  Lungs:  CTAB, no access msc use Cardio:  RRR, no bruits, trace b/l LE edema Psych: Age appropriate judgment and insight  Assessment:   Type 2 diabetes mellitus with hyperglycemia, without long-term current use of insulin (HCC) - Plan: Hemoglobin A1c, Comprehensive metabolic panel, Lipid panel   Plan:   Chronic, uncontrolled. Cont Metformin 1000  mg bid for now. Counseled on diet and exercise. He is doing an excellent job losing wt.  F/u in 3-6 mo. The patient voiced understanding and agreement to the plan.  [place], DO 08/26/20 8:53 AM

## 2020-08-27 ENCOUNTER — Ambulatory Visit (INDEPENDENT_AMBULATORY_CARE_PROVIDER_SITE_OTHER): Payer: 59 | Admitting: Family Medicine

## 2020-08-30 ENCOUNTER — Other Ambulatory Visit: Payer: Self-pay | Admitting: Family Medicine

## 2020-08-30 MED ORDER — PREDNISONE 20 MG PO TABS: 40.0000 mg | ORAL_TABLET | Freq: Every day | ORAL | 0 refills | Status: AC

## 2020-09-06 ENCOUNTER — Ambulatory Visit: Payer: 59 | Admitting: Cardiology

## 2020-09-09 ENCOUNTER — Other Ambulatory Visit: Payer: Self-pay

## 2020-09-09 ENCOUNTER — Ambulatory Visit (INDEPENDENT_AMBULATORY_CARE_PROVIDER_SITE_OTHER): Payer: PRIVATE HEALTH INSURANCE | Admitting: Family Medicine

## 2020-09-09 ENCOUNTER — Encounter (INDEPENDENT_AMBULATORY_CARE_PROVIDER_SITE_OTHER): Payer: Self-pay | Admitting: Family Medicine

## 2020-09-09 VITALS — BP 138/80 | HR 63 | Temp 98.2°F | Ht 69.0 in | Wt 261.0 lb

## 2020-09-09 DIAGNOSIS — Z6841 Body Mass Index (BMI) 40.0 and over, adult: Secondary | ICD-10-CM

## 2020-09-09 DIAGNOSIS — Z9189 Other specified personal risk factors, not elsewhere classified: Secondary | ICD-10-CM

## 2020-09-09 DIAGNOSIS — E1159 Type 2 diabetes mellitus with other circulatory complications: Secondary | ICD-10-CM

## 2020-09-09 DIAGNOSIS — M109 Gout, unspecified: Secondary | ICD-10-CM

## 2020-09-09 DIAGNOSIS — I152 Hypertension secondary to endocrine disorders: Secondary | ICD-10-CM

## 2020-09-09 DIAGNOSIS — E559 Vitamin D deficiency, unspecified: Secondary | ICD-10-CM

## 2020-09-09 MED ORDER — VITAMIN D (ERGOCALCIFEROL) 1.25 MG (50000 UNIT) PO CAPS: 50000.0000 [IU] | ORAL_CAPSULE | ORAL | 0 refills | Status: DC

## 2020-09-11 NOTE — Progress Notes (Signed)
Chief Complaint:   OBESITY Travis Fields is here to discuss his progress with his obesity treatment plan along with follow-up of his obesity related diagnoses. Travis Fields is on the Category 4 Plan and states he is following his eating plan approximately 90-95% of the time. Travis Fields states he is walking 80 minutes 5 times per week.  Today's visit was #: 5 Starting weight: 303 lbs Starting date: 05/28/2020 Today's weight: 261 lbs Today's date: 09/09/2020 Total lbs lost to date: 42 Total lbs lost since last in-office visit: 6  Interim History: Travis Fields is experiencing increased frequency of gout attacks. Since his last appt, he has had 2 events and 1 of which required steroids; he was unable to walk for the 2nd attack. Pt has been walking more too. He is interested in prepping food that's ore flavorful long term.  Subjective:   1. Gout of multiple sites, unspecified cause, unspecified chronicity Recent increase in attacks with weight loss. Travis Fields is not on preventative medicine.  2. Vitamin D deficiency He is currently taking prescription vitamin D 50,000 IU each week. He denies nausea, vomiting or muscle weakness but notes fatigue. Last Vit D level 30.1.  Lab Results  Component Value Date   VD25OH 30.1 05/28/2020   3. Hypertension associated with diabetes ([place]) Travis Fields's BP is very well controlled. He is only on Benicar 20 mg, 1/2 tab daily.  BP Readings from Last 3 Encounters:  09/09/20 138/80  08/26/20 130/86  07/29/20 108/64   4. At risk for osteoporosis Travis Fields is at higher risk of osteopenia and osteoporosis due to Vitamin D deficiency.   Assessment/Plan:   1. Gout of multiple sites, unspecified cause, unspecified chronicity Kristof is to follow up with PCP for possible long term control medication.  2. Vitamin D deficiency Low Vitamin D level contributes to fatigue and are associated with obesity, breast, and colon cancer. He agrees to continue to take prescription Vitamin D  @50 ,000 IU every week and will follow-up for routine testing of Vitamin D, at least 2-3 times per year to avoid over-replacement.  Refill- Vitamin D, Ergocalciferol, (DRISDOL) 1.25 MG (50000 UNIT) CAPS capsule; Take 1 capsule (50,000 Units total) by mouth every 7 (seven) days.  Dispense: 4 capsule; Refill: 0  3. Hypertension associated with diabetes ([place]) Travis Fields is working on healthy weight loss and exercise to improve blood pressure control. We will watch for signs of hypotension as he continues his lifestyle modifications. Continue current medication and follow up BP at next appt.  4. At risk for osteoporosis Travis Fields was given approximately 15 minutes of osteoporosis prevention counseling today. Travis Fields is at risk for osteopenia and osteoporosis due to his Vitamin D deficiency. He was encouraged to take his Vitamin D and follow his higher calcium diet and increase strengthening exercise to help strengthen his bones and decrease his risk of osteopenia and osteoporosis.  Repetitive spaced learning was employed today to elicit superior memory formation and behavioral change.   5. Class 3 severe obesity with serious comorbidity and body mass index (BMI) of 45.0 to 49.9 in adult, unspecified obesity type [facility])  Travis Fields is currently in the action stage of change. As such, his goal is to continue with weight loss efforts. He has agreed to the Category 4 Plan and keeping a food journal and adhering to recommended goals of 550-700 calories and 50+ g protein with supper.   Exercise goals:  As is  Behavioral modification strategies: increasing lean protein intake.  Travis Fields has agreed to follow-up  with our clinic in 3 weeks. He was informed of the importance of frequent follow-up visits to maximize his success with intensive lifestyle modifications for his multiple health conditions.   Objective:   Blood pressure 138/80, pulse 63, temperature 98.2 F (36.8 C), height 5\' 9"  (1.753 m), weight 261 lb (118.4  kg), SpO2 98 %. Body mass index is 38.54 kg/m.  General: Cooperative, alert, well developed, in no acute distress. HEENT: Conjunctivae and lids unremarkable. Cardiovascular: Regular rhythm.  Lungs: Normal work of breathing. Neurologic: No focal deficits.   Lab Results  Component Value Date   CREATININE 1.15 08/26/2020   BUN 17 08/26/2020   NA 143 08/26/2020   K 4.2 08/26/2020   CL 106 08/26/2020   CO2 27 08/26/2020   Lab Results  Component Value Date   ALT 23 08/26/2020   AST 23 08/26/2020   ALKPHOS 54 08/26/2020   BILITOT 0.9 08/26/2020   Lab Results  Component Value Date   HGBA1C 6.1 08/26/2020   HGBA1C 8.3 (H) 05/28/2020   HGBA1C 6.2 10/25/2017   HGBA1C 5.9 12/05/2015   Lab Results  Component Value Date   INSULIN 46.3 (H) 05/28/2020   Lab Results  Component Value Date   TSH 2.240 05/28/2020   Lab Results  Component Value Date   CHOL 104 08/26/2020   HDL 35.90 (L) 08/26/2020   LDLCALC 56 08/26/2020   TRIG 59.0 08/26/2020   CHOLHDL 3 08/26/2020   Lab Results  Component Value Date   VD25OH 30.1 05/28/2020   Lab Results  Component Value Date   WBC 6.9 05/28/2020   HGB 16.5 05/28/2020   HCT 49.8 05/28/2020   MCV 87 05/28/2020   PLT 152 05/28/2020   No results found for: IRON, TIBC, FERRITIN  Attestation Statements:   Reviewed by clinician on day of visit: allergies, medications, problem list, medical history, surgical history, family history, social history, and previous encounter notes.  Coral Ceo, CMA, am acting as transcriptionist for Coralie Common, MD.   I have reviewed the above documentation for accuracy and completeness, and I agree with the above. - Jinny Blossom, MD

## 2020-09-18 ENCOUNTER — Other Ambulatory Visit: Payer: Self-pay | Admitting: Family Medicine

## 2020-09-18 DIAGNOSIS — M109 Gout, unspecified: Secondary | ICD-10-CM

## 2020-09-18 NOTE — Progress Notes (Signed)
Uric acid

## 2020-09-19 ENCOUNTER — Other Ambulatory Visit: Payer: Self-pay

## 2020-09-19 ENCOUNTER — Other Ambulatory Visit (INDEPENDENT_AMBULATORY_CARE_PROVIDER_SITE_OTHER): Payer: 59

## 2020-09-19 DIAGNOSIS — M109 Gout, unspecified: Secondary | ICD-10-CM | POA: Diagnosis not present

## 2020-09-19 LAB — URIC ACID: Uric Acid, Serum: 7.7 mg/dL (ref 4.0–7.8)

## 2020-09-30 ENCOUNTER — Ambulatory Visit (INDEPENDENT_AMBULATORY_CARE_PROVIDER_SITE_OTHER): Payer: PRIVATE HEALTH INSURANCE | Admitting: Family Medicine

## 2020-10-07 ENCOUNTER — Encounter (INDEPENDENT_AMBULATORY_CARE_PROVIDER_SITE_OTHER): Payer: Self-pay | Admitting: Family Medicine

## 2020-10-07 ENCOUNTER — Other Ambulatory Visit: Payer: Self-pay

## 2020-10-07 ENCOUNTER — Ambulatory Visit (INDEPENDENT_AMBULATORY_CARE_PROVIDER_SITE_OTHER): Payer: 59 | Admitting: Family Medicine

## 2020-10-07 VITALS — BP 130/77 | HR 60 | Temp 97.7°F | Ht 69.0 in | Wt 258.0 lb

## 2020-10-07 DIAGNOSIS — Z6841 Body Mass Index (BMI) 40.0 and over, adult: Secondary | ICD-10-CM | POA: Diagnosis not present

## 2020-10-07 DIAGNOSIS — Z9189 Other specified personal risk factors, not elsewhere classified: Secondary | ICD-10-CM

## 2020-10-07 DIAGNOSIS — E559 Vitamin D deficiency, unspecified: Secondary | ICD-10-CM

## 2020-10-07 DIAGNOSIS — E1165 Type 2 diabetes mellitus with hyperglycemia: Secondary | ICD-10-CM | POA: Diagnosis not present

## 2020-10-07 MED ORDER — VITAMIN D (ERGOCALCIFEROL) 1.25 MG (50000 UNIT) PO CAPS: 50000.0000 [IU] | ORAL_CAPSULE | ORAL | 0 refills | Status: DC

## 2020-10-09 NOTE — Progress Notes (Signed)
Chief Complaint:   OBESITY Travis Fields is here to discuss his progress with his obesity treatment plan along with follow-up of his obesity related diagnoses. Travis Fields is on the Category 4 Plan and states he is following his eating plan approximately 70% of the time. Travis Fields states he is walking 3 miles 7 times per week.  Today's visit was #: 6 Starting weight: 303 lbs Starting date: 05/28/2020 Today's weight: 258 lbs Today's date: 10/07/2020 Total lbs lost to date: 45 Total lbs lost since last in-office visit: 3  Interim History: Since his last appt, Travis Fields has been working and entertaining some visitors (sister and her family). He has eaten indulgently a little more than previously. He is using this week to get a little more on track. He has no upcoming plans except a trip to [place] at the end of August.   Subjective:   1. Vitamin D deficiency Travis Fields denies nausea, vomiting, and muscle weakness but notes fatigue. Pt is on prescription Vit D.  2. Type 2 diabetes mellitus with hyperglycemia, without long-term current use of insulin ([facility]) Travis Fields is on Metformin. His last A1c was 6.1 and insulin level 16.3.  3. At risk for osteoporosis Travis Fields is at higher risk of osteopenia and osteoporosis due to Vitamin D deficiency.   Assessment/Plan:   1. Vitamin D deficiency Low Vitamin D level contributes to fatigue and are associated with obesity, breast, and colon cancer. He agrees to continue to take prescription Vitamin D '@50'$ ,000 IU every week and will follow-up for routine testing of Vitamin D, at least 2-3 times per year to avoid over-replacement.  Refill- Vitamin D, Ergocalciferol, (DRISDOL) 1.25 MG (50000 UNIT) CAPS capsule; Take 1 capsule (50,000 Units total) by mouth every 7 (seven) days.  Dispense: 4 capsule; Refill: 0  2. Type 2 diabetes mellitus with hyperglycemia, without long-term current use of insulin (HCC) Good blood sugar control is important to decrease the likelihood of  diabetic complications such as nephropathy, neuropathy, limb loss, blindness, coronary artery disease, and death. Intensive lifestyle modification including diet, exercise and weight loss are the first line of treatment for diabetes. Repeat labs in October.  3. At risk for osteoporosis Travis Fields was given approximately 15 minutes of osteoporosis prevention counseling today. Travis Fields is at risk for osteopenia and osteoporosis due to his Vitamin D deficiency. He was encouraged to take his Vitamin D and follow his higher calcium diet and increase strengthening exercise to help strengthen his bones and decrease his risk of osteopenia and osteoporosis.  Repetitive spaced learning was employed today to elicit superior memory formation and behavioral change.  4. Obesity with current BMI of   Travis Fields is currently in the action stage of change. As such, his goal is to continue with weight loss efforts. He has agreed to the Category 4 Plan.   Exercise goals:  As is- add resistance app for men.  Behavioral modification strategies: increasing lean protein intake, meal planning and cooking strategies, keeping healthy foods in the home, and planning for success.  Travis Fields has agreed to follow-up with our clinic in 3 weeks. He was informed of the importance of frequent follow-up visits to maximize his success with intensive lifestyle modifications for his multiple health conditions.   Objective:   Blood pressure 130/77, pulse 60, temperature 97.7 F (36.5 C), height '5\' 9"'$  (1.753 m), SpO2 98 %. Body mass index is 38.54 kg/m.  General: Cooperative, alert, well developed, in no acute distress. HEENT: Conjunctivae and lids unremarkable. Cardiovascular: Regular rhythm.  Lungs: Normal work of breathing. Neurologic: No focal deficits.   Lab Results  Component Value Date   CREATININE 1.15 08/26/2020   BUN 17 08/26/2020   NA 143 08/26/2020   K 4.2 08/26/2020   CL 106 08/26/2020   CO2 27 08/26/2020   Lab  Results  Component Value Date   ALT 23 08/26/2020   AST 23 08/26/2020   ALKPHOS 54 08/26/2020   BILITOT 0.9 08/26/2020   Lab Results  Component Value Date   HGBA1C 6.1 08/26/2020   HGBA1C 8.3 (H) 05/28/2020   HGBA1C 6.2 10/25/2017   HGBA1C 5.9 12/05/2015   Lab Results  Component Value Date   INSULIN 46.3 (H) 05/28/2020   Lab Results  Component Value Date   TSH 2.240 05/28/2020   Lab Results  Component Value Date   CHOL 104 08/26/2020   HDL 35.90 (L) 08/26/2020   LDLCALC 56 08/26/2020   TRIG 59.0 08/26/2020   CHOLHDL 3 08/26/2020   Lab Results  Component Value Date   VD25OH 30.1 05/28/2020   Lab Results  Component Value Date   WBC 6.9 05/28/2020   HGB 16.5 05/28/2020   HCT 49.8 05/28/2020   MCV 87 05/28/2020   PLT 152 05/28/2020    Attestation Statements:   Reviewed by clinician on day of visit: allergies, medications, problem list, medical history, surgical history, family history, social history, and previous encounter notes.  Coral Ceo, CMA, am acting as transcriptionist for Coralie Common, MD.   I have reviewed the above documentation for accuracy and completeness, and I agree with the above. - Coralie Common, MD

## 2020-10-11 ENCOUNTER — Ambulatory Visit (INDEPENDENT_AMBULATORY_CARE_PROVIDER_SITE_OTHER): Payer: 59 | Admitting: Family Medicine

## 2020-10-11 ENCOUNTER — Other Ambulatory Visit: Payer: Self-pay

## 2020-10-11 ENCOUNTER — Encounter: Payer: Self-pay | Admitting: Family Medicine

## 2020-10-11 VITALS — BP 122/78 | HR 75 | Temp 98.6°F | Ht 69.0 in | Wt 261.5 lb

## 2020-10-11 DIAGNOSIS — M109 Gout, unspecified: Secondary | ICD-10-CM

## 2020-10-11 MED ORDER — ALLOPURINOL 100 MG PO TABS: 100.0000 mg | ORAL_TABLET | Freq: Every day | ORAL | 6 refills | Status: DC

## 2020-10-11 MED ORDER — PREDNISONE 20 MG PO TABS: 40.0000 mg | ORAL_TABLET | Freq: Every day | ORAL | 1 refills | Status: AC

## 2020-10-11 NOTE — Progress Notes (Signed)
Musculoskeletal Exam  Patient: Travis Fields DOB: 1965-11-14  DOS: 10/11/2020  SUBJECTIVE:  Chief Complaint:   Chief Complaint  Patient presents with   Gout    Travis Fields is a 55 y.o.  male for evaluation and treatment of R ankle pain.   Onset:  4 days ago. No inj or change in activity.  Location: R ankle Character:  sharp  Progression of issue:  has slightly improved Associated symptoms: swelling Denies: Redness, bruising, fevers Treatment: to date has been OTC NSAIDS and acetaminophen.   Neurovascular symptoms: no  Past Medical History:  Diagnosis Date   Allergy    Asthma    Back pain    Bilateral swelling of feet    Chronic kidney disease    kidney stones    Essential hypertension 03/29/2017   GERD (gastroesophageal reflux disease)    dx'd with GERD 10- 12 yrs ago with EGD    Gout    Heartburn    High cholesterol    History of chicken pox    History of kidney stones    History of shingles    Hypertension    Joint pain    Morbid obesity ([place]) 12/05/2015   OSA on CPAP 12/05/2015   Right foot pain 06/07/2017   Seasonal allergies    Sleep apnea    wears cpap    SOB (shortness of breath)    Swallowing difficulty    Type 2 diabetes mellitus with hyperglycemia, without long-term current use of insulin ([place]) 05/29/2020    Objective: VITAL SIGNS: BP 122/78   Pulse 75   Temp 98.6 F (37 C) (Oral)   Ht '5\' 9"'$  (1.753 m)   Wt 261 lb 8 oz (118.6 kg)   SpO2 97%   BMI 38.62 kg/m  Constitutional: Well formed, well developed. No acute distress. Thorax & Lungs: No accessory muscle use Musculoskeletal: L ankle.   Normal active range of motion: yes.   Normal passive range of motion: yes Tenderness to palpation: yes Deformity: no Ecchymosis: no Tests positive: none Tests negative: ant drawer, squeeze Neurologic: Normal sensory function. Gait antalgic. Psychiatric: Normal mood. Age appropriate judgment and insight. Alert & oriented x 3.     Assessment:  Acute gout of right ankle, unspecified cause  Plan: Chronic, uncontrolled/exacerbation.  5-day prednisone burst 40 mg daily as this has previously been effective.  Since he has been having monthly flares due to weight loss, will start allopurinol 100 mg daily.  Stretches/exercises for his back given, heat, ice, Tylenol.  Some dietary advice provided and paperwork. F/u in 2 weeks as originally scheduled. The patient voiced understanding and agreement to the plan.   [place], DO 10/11/20  12:10 PM

## 2020-10-11 NOTE — Patient Instructions (Signed)
Ice/cold pack over area for 10-15 min twice daily.  Heat (pad or rice pillow in microwave) over affected area, 10-15 minutes twice daily.   Foods to AVOID: Red meat, organ meat (liver), lunch meat, seafood (mussels, scallops, anchovies, etc) Alcohol Sugary foods/beverages (diet soft drinks have no link to flares)  Foods to migrate to: Dairy Vegetables Cherries have limited data to suggest they help lower uric acid levels (and prevent flares) Vit C (500 mg daily) may have a modest effect with preventing flares Poultry If you are going to eat red meat, beef and pork may give you less problems than lamb.  Let us know if you need anything.  EXERCISES  RANGE OF MOTION (ROM) AND STRETCHING EXERCISES - Low Back Pain Most people with lower back pain will find that their symptoms get worse with excessive bending forward (flexion) or arching at the lower back (extension). The exercises that will help resolve your symptoms will focus on the opposite motion.  If you have pain, numbness or tingling which travels down into your buttocks, leg or foot, the goal of the therapy is for these symptoms to move closer to your back and eventually resolve. Sometimes, these leg symptoms will get better, but your lower back pain may worsen. This is often an indication of progress in your rehabilitation. Be very alert to any changes in your symptoms and the activities in which you participated in the 24 hours prior to the change. Sharing this information with your caregiver will allow him or her to most efficiently treat your condition. These exercises may help you when beginning to rehabilitate your injury. Your symptoms may resolve with or without further involvement from your physician, physical therapist or athletic trainer. While completing these exercises, remember:  Restoring tissue flexibility helps normal motion to return to the joints. This allows healthier, less painful movement and activity. An effective  stretch should be held for at least 30 seconds. A stretch should never be painful. You should only feel a gentle lengthening or release in the stretched tissue. FLEXION RANGE OF MOTION AND STRETCHING EXERCISES:  STRETCH - Flexion, Single Knee to Chest  Lie on a firm bed or floor with both legs extended in front of you. Keeping one leg in contact with the floor, bring your opposite knee to your chest. Hold your leg in place by either grabbing behind your thigh or at your knee. Pull until you feel a gentle stretch in your low back. Hold 30 seconds. Slowly release your grasp and repeat the exercise with the opposite side. Repeat 2 times. Complete this exercise 3 times per week.   STRETCH - Flexion, Double Knee to Chest Lie on a firm bed or floor with both legs extended in front of you. Keeping one leg in contact with the floor, bring your opposite knee to your chest. Tense your stomach muscles to support your back and then lift your other knee to your chest. Hold your legs in place by either grabbing behind your thighs or at your knees. Pull both knees toward your chest until you feel a gentle stretch in your low back. Hold 30 seconds. Tense your stomach muscles and slowly return one leg at a time to the floor. Repeat 2 times. Complete this exercise 3 times per week.   STRETCH - Low Trunk Rotation Lie on a firm bed or floor. Keeping your legs in front of you, bend your knees so they are both pointed toward the ceiling and your feet are flat  on the floor. Extend your arms out to the side. This will stabilize your upper body by keeping your shoulders in contact with the floor. Gently and slowly drop both knees together to one side until you feel a gentle stretch in your low back. Hold for 30 seconds. Tense your stomach muscles to support your lower back as you bring your knees back to the starting position. Repeat the exercise to the other side. Repeat 2 times. Complete this exercise at least 3  times per week.   EXTENSION RANGE OF MOTION AND FLEXIBILITY EXERCISES:  STRETCH - Extension, Prone on Elbows  Lie on your stomach on the floor, a bed will be too soft. Place your palms about shoulder width apart and at the height of your head. Place your elbows under your shoulders. If this is too painful, stack pillows under your chest. Allow your body to relax so that your hips drop lower and make contact more completely with the floor. Hold this position for 30 seconds. Slowly return to lying flat on the floor. Repeat 2 times. Complete this exercise 3 times per week.   RANGE OF MOTION - Extension, Prone Press Ups Lie on your stomach on the floor, a bed will be too soft. Place your palms about shoulder width apart and at the height of your head. Keeping your back as relaxed as possible, slowly straighten your elbows while keeping your hips on the floor. You may adjust the placement of your hands to maximize your comfort. As you gain motion, your hands will come more underneath your shoulders. Hold this position 30 seconds. Slowly return to lying flat on the floor. Repeat 2 times. Complete this exercise 3 times per week.   RANGE OF MOTION- Quadruped, Neutral Spine  Assume a hands and knees position on a firm surface. Keep your hands under your shoulders and your knees under your hips. You may place padding under your knees for comfort. Drop your head and point your tailbone toward the ground below you. This will round out your lower back like an angry cat. Hold this position for 30 seconds. Slowly lift your head and release your tail bone so that your back sags into a large arch, like an old horse. Hold this position for 30 seconds. Repeat this until you feel limber in your low back. Now, find your "sweet spot." This will be the most comfortable position somewhere between the two previous positions. This is your neutral spine. Once you have found this position, tense your stomach muscles to  support your low back. Hold this position for 30 seconds. Repeat 2 times. Complete this exercise 3 times per week.   STRENGTHENING EXERCISES - Low Back Sprain These exercises may help you when beginning to rehabilitate your injury. These exercises should be done near your "sweet spot." This is the neutral, low-back arch, somewhere between fully rounded and fully arched, that is your least painful position. When performed in this safe range of motion, these exercises can be used for people who have either a flexion or extension based injury. These exercises may resolve your symptoms with or without further involvement from your physician, physical therapist or athletic trainer. While completing these exercises, remember:  Muscles can gain both the endurance and the strength needed for everyday activities through controlled exercises. Complete these exercises as instructed by your physician, physical therapist or athletic trainer. Increase the resistance and repetitions only as guided. You may experience muscle soreness or fatigue, but the pain or discomfort  you are trying to eliminate should never worsen during these exercises. If this pain does worsen, stop and make certain you are following the directions exactly. If the pain is still present after adjustments, discontinue the exercise until you can discuss the trouble with your caregiver.  STRENGTHENING - Deep Abdominals, Pelvic Tilt  Lie on a firm bed or floor. Keeping your legs in front of you, bend your knees so they are both pointed toward the ceiling and your feet are flat on the floor. Tense your lower abdominal muscles to press your low back into the floor. This motion will rotate your pelvis so that your tail bone is scooping upwards rather than pointing at your feet or into the floor. With a gentle tension and even breathing, hold this position for 3 seconds. Repeat 2 times. Complete this exercise 3 times per week.   STRENGTHENING -  Abdominals, Crunches  Lie on a firm bed or floor. Keeping your legs in front of you, bend your knees so they are both pointed toward the ceiling and your feet are flat on the floor. Cross your arms over your chest. Slightly tip your chin down without bending your neck. Tense your abdominals and slowly lift your trunk high enough to just clear your shoulder blades. Lifting higher can put excessive stress on the lower back and does not further strengthen your abdominal muscles. Control your return to the starting position. Repeat 2 times. Complete this exercise 3 times per week.   STRENGTHENING - Quadruped, Opposite UE/LE Lift  Assume a hands and knees position on a firm surface. Keep your hands under your shoulders and your knees under your hips. You may place padding under your knees for comfort. Find your neutral spine and gently tense your abdominal muscles so that you can maintain this position. Your shoulders and hips should form a rectangle that is parallel with the floor and is not twisted. Keeping your trunk steady, lift your right hand no higher than your shoulder and then your left leg no higher than your hip. Make sure you are not holding your breath. Hold this position for 30 seconds. Continuing to keep your abdominal muscles tense and your back steady, slowly return to your starting position. Repeat with the opposite arm and leg. Repeat 2 times. Complete this exercise 3 times per week.   STRENGTHENING - Abdominals and Quadriceps, Straight Leg Raise  Lie on a firm bed or floor with both legs extended in front of you. Keeping one leg in contact with the floor, bend the other knee so that your foot can rest flat on the floor. Find your neutral spine, and tense your abdominal muscles to maintain your spinal position throughout the exercise. Slowly lift your straight leg off the floor about 6 inches for a count of 3, making sure to not hold your breath. Still keeping your neutral spine,  slowly lower your leg all the way to the floor. Repeat this exercise with each leg 2 times. Complete this exercise 3 times per week.  POSTURE AND BODY MECHANICS CONSIDERATIONS - Low Back Sprain Keeping correct posture when sitting, standing or completing your activities will reduce the stress put on different body tissues, allowing injured tissues a chance to heal and limiting painful experiences. The following are general guidelines for improved posture.  While reading these guidelines, remember: The exercises prescribed by your provider will help you have the flexibility and strength to maintain correct postures. The correct posture provides the best environment for your  joints to work. All of your joints have less wear and tear when properly supported by a spine with good posture. This means you will experience a healthier, less painful body. Correct posture must be practiced with all of your activities, especially prolonged sitting and standing. Correct posture is as important when doing repetitive low-stress activities (typing) as it is when doing a single heavy-load activity (lifting).  RESTING POSITIONS Consider which positions are most painful for you when choosing a resting position. If you have pain with flexion-based activities (sitting, bending, stooping, squatting), choose a position that allows you to rest in a less flexed posture. You would want to avoid curling into a fetal position on your side. If your pain worsens with extension-based activities (prolonged standing, working overhead), avoid resting in an extended position such as sleeping on your stomach. Most people will find more comfort when they rest with their spine in a more neutral position, neither too rounded nor too arched. Lying on a non-sagging bed on your side with a pillow between your knees, or on your back with a pillow under your knees will often provide some relief. Keep in mind, being in any one position for a  prolonged period of time, no matter how correct your posture, can still lead to stiffness.  PROPER SITTING POSTURE In order to minimize stress and discomfort on your spine, you must sit with correct posture. Sitting with good posture should be effortless for a healthy body. Returning to good posture is a gradual process. Many people can work toward this most comfortably by using various supports until they have the flexibility and strength to maintain this posture on their own. When sitting with proper posture, your ears will fall over your shoulders and your shoulders will fall over your hips. You should use the back of the chair to support your upper back. Your lower back will be in a neutral position, just slightly arched. You may place a small pillow or folded towel at the base of your lower back for  support.  When working at a desk, create an environment that supports good, upright posture. Without extra support, muscles tire, which leads to excessive strain on joints and other tissues. Keep these recommendations in mind:  CHAIR: A chair should be able to slide under your desk when your back makes contact with the back of the chair. This allows you to work closely. The chair's height should allow your eyes to be level with the upper part of your monitor and your hands to be slightly lower than your elbows.  BODY POSITION Your feet should make contact with the floor. If this is not possible, use a foot rest. Keep your ears over your shoulders. This will reduce stress on your neck and low back.  INCORRECT SITTING POSTURES  If you are feeling tired and unable to assume a healthy sitting posture, do not slouch or slump. This puts excessive strain on your back tissues, causing more damage and pain. Healthier options include: Using more support, like a lumbar pillow. Switching tasks to something that requires you to be upright or walking. Talking a brief walk. Lying down to rest in a  neutral-spine position.  PROLONGED STANDING WHILE SLIGHTLY LEANING FORWARD  When completing a task that requires you to lean forward while standing in one place for a long time, place either foot up on a stationary 2-4 inch high object to help maintain the best posture. When both feet are on the ground, the lower  back tends to lose its slight inward curve. If this curve flattens (or becomes too large), then the back and your other joints will experience too much stress, tire more quickly, and can cause pain.  CORRECT STANDING POSTURES Proper standing posture should be assumed with all daily activities, even if they only take a few moments, like when brushing your teeth. As in sitting, your ears should fall over your shoulders and your shoulders should fall over your hips. You should keep a slight tension in your abdominal muscles to brace your spine. Your tailbone should point down to the ground, not behind your body, resulting in an over-extended swayback posture.   INCORRECT STANDING POSTURES  Common incorrect standing postures include a forward head, locked knees and/or an excessive swayback. WALKING Walk with an upright posture. Your ears, shoulders and hips should all line-up.  PROLONGED ACTIVITY IN A FLEXED POSITION When completing a task that requires you to bend forward at your waist or lean over a low surface, try to find a way to stabilize 3 out of 4 of your limbs. You can place a hand or elbow on your thigh or rest a knee on the surface you are reaching across. This will provide you more stability, so that your muscles do not tire as quickly. By keeping your knees relaxed, or slightly bent, you will also reduce stress across your lower back. CORRECT LIFTING TECHNIQUES  DO : Assume a wide stance. This will provide you more stability and the opportunity to get as close as possible to the object which you are lifting. Tense your abdominals to brace your spine. Bend at the knees and hips.  Keeping your back locked in a neutral-spine position, lift using your leg muscles. Lift with your legs, keeping your back straight. Test the weight of unknown objects before attempting to lift them. Try to keep your elbows locked down at your sides in order get the best strength from your shoulders when carrying an object.   Always ask for help when lifting heavy or awkward objects. INCORRECT LIFTING TECHNIQUES DO NOT:  Lock your knees when lifting, even if it is a small object. Bend and twist. Pivot at your feet or move your feet when needing to change directions. Assume that you can safely pick up even a paperclip without proper posture.

## 2020-10-14 ENCOUNTER — Telehealth: Payer: Self-pay

## 2020-10-14 ENCOUNTER — Encounter: Payer: Self-pay | Admitting: Family Medicine

## 2020-10-14 ENCOUNTER — Telehealth (INDEPENDENT_AMBULATORY_CARE_PROVIDER_SITE_OTHER): Payer: 59 | Admitting: Family Medicine

## 2020-10-14 DIAGNOSIS — U071 COVID-19: Secondary | ICD-10-CM | POA: Diagnosis not present

## 2020-10-14 MED ORDER — MOLNUPIRAVIR EUA 200MG CAPSULE: 4.0000 | ORAL_CAPSULE | Freq: Two times a day (BID) | ORAL | 0 refills | Status: AC

## 2020-10-14 NOTE — Telephone Encounter (Signed)
Patient called stating he tested Covid + today after being around some friends who tested positive as well.  Symptoms are scratchy throat and mild fever of 99.4.  Patient is wanting to know what he should do /take since testing positive.

## 2020-10-14 NOTE — Progress Notes (Signed)
Chief Complaint  Patient presents with   Covid Positive    Low grade fever    Sore Throat    Travis Fields here for URI complaints. Due to COVID-19 pandemic, we are interacting via web portal for an electronic face-to-face visit. I verified patient's ID using 2 identifiers. Patient agreed to proceed with visit via this method. Patient is at home, I am at office. Patient and I are present for visit.   Duration: 1 day  Associated symptoms: slight cough, sinus drainage, sore throat and malaise Denies: sinus congestion, sinus pain, itchy watery eyes, ear pain, ear drainage, wheezing, shortness of breath, myalgia, N/V/D, fevers, loss of smell/taste Treatment to date: on prednisone for gout Sick contacts: Yes; had some friends they hung out with on 7/29 who tested +.  Tested + 8/1.   Past Medical History:  Diagnosis Date   Allergy    Asthma    Back pain    Bilateral swelling of feet    Chronic kidney disease    kidney stones    Essential hypertension 03/29/2017   GERD (gastroesophageal reflux disease)    dx'd with GERD 10- 12 yrs ago with EGD    Gout    Heartburn    High cholesterol    History of chicken pox    History of kidney stones    History of shingles    Hypertension    Joint pain    Morbid obesity ([place]) 12/05/2015   OSA on CPAP 12/05/2015   Right foot pain 06/07/2017   Seasonal allergies    Sleep apnea    wears cpap    SOB (shortness of breath)    Swallowing difficulty    Type 2 diabetes mellitus with hyperglycemia, without long-term current use of insulin ([place]) 05/29/2020    Objective No conversational dyspnea Age appropriate judgment and insight Nml affect and mood  COVID-19 - Plan: molnupiravir EUA 200 mg CAPS  Given severe obesity, OSA, DM, HTN and asthma hx, will tx w 5 d of above, BID. Continue to push fluids, practice good hand hygiene, cover mouth when coughing. F/u prn. If starting to experience irreplaceable fluid loss, shaking, or shortness of breath,  seek immediate care. Pt voiced understanding and agreement to the plan.  [place], DO 10/14/20 4:24 PM

## 2020-10-14 NOTE — Telephone Encounter (Signed)
Called the patient and offered a video visit today at 4:15 ok per PCP Patient agreed to do.

## 2020-10-16 ENCOUNTER — Ambulatory Visit: Payer: Self-pay | Admitting: Family Medicine

## 2020-10-22 ENCOUNTER — Other Ambulatory Visit: Payer: Self-pay

## 2020-10-22 ENCOUNTER — Ambulatory Visit (INDEPENDENT_AMBULATORY_CARE_PROVIDER_SITE_OTHER): Payer: 59 | Admitting: Cardiology

## 2020-10-22 ENCOUNTER — Encounter: Payer: Self-pay | Admitting: Cardiology

## 2020-10-22 VITALS — BP 142/84 | HR 67 | Ht 69.0 in | Wt 259.0 lb

## 2020-10-22 DIAGNOSIS — G4733 Obstructive sleep apnea (adult) (pediatric): Secondary | ICD-10-CM

## 2020-10-22 DIAGNOSIS — I10 Essential (primary) hypertension: Secondary | ICD-10-CM | POA: Diagnosis not present

## 2020-10-22 DIAGNOSIS — E7800 Pure hypercholesterolemia, unspecified: Secondary | ICD-10-CM | POA: Diagnosis not present

## 2020-10-22 DIAGNOSIS — Z9989 Dependence on other enabling machines and devices: Secondary | ICD-10-CM

## 2020-10-22 DIAGNOSIS — R0602 Shortness of breath: Secondary | ICD-10-CM | POA: Diagnosis not present

## 2020-10-22 NOTE — Patient Instructions (Addendum)

## 2020-10-22 NOTE — Progress Notes (Signed)
Cardiology Office Note:    Date:  10/22/2020   ID:  Travis Fields, DOB 07-14-1965, MRN [id number]  PCP:  Shelda Pal, DO  Cardiologist:  Jenne Campus, MD    Referring MD: Shelda Pal*   Chief Complaint  Patient presents with   Follow-up  Am doing very well  History of Present Illness:    Travis Fields is a 55 y.o. male with past medical history significant for essential hypertension, diabetes, morbid obesity, obstructive sleep apnea, dyslipidemia.  He was referred to [place] because of nonspecific EKG changes.  Echocardiogram has been performed which showed preserved left ventricle ejection fraction, he does not have any cardiac symptomatology.  He does not have any chest pain tightness squeezing pressure burning chest there is some exertional shortness of breath that is getting better.  There is no swelling of lower extremities no palpitations. He comes today to my office for follow-up overall he is doing well.  Still asymptomatic.  He lost about 50 pounds he participates in our weight management program brand diagnosis: He is very satisfied with the care he is getting better in a matter fact next week have another appointment with them.  Past Medical History:  Diagnosis Date   Allergy    Asthma    Back pain    Bilateral swelling of feet    Chronic kidney disease    kidney stones    Essential hypertension 03/29/2017   GERD (gastroesophageal reflux disease)    dx'd with GERD 10- 12 yrs ago with EGD    Gout    Heartburn    High cholesterol    History of chicken pox    History of kidney stones    History of shingles    Hypertension    Joint pain    Morbid obesity ([place]) 12/05/2015   OSA on CPAP 12/05/2015   Right foot pain 06/07/2017   Seasonal allergies    Sleep apnea    wears cpap    SOB (shortness of breath)    Swallowing difficulty    Type 2 diabetes mellitus with hyperglycemia, without long-term current use of insulin ([place]) 05/29/2020    Past  Surgical History:  Procedure Laterality Date   INNER EAR SURGERY     UPPER GASTROINTESTINAL ENDOSCOPY      Current Medications: Current Meds  Medication Sig   albuterol (VENTOLIN HFA) 108 (90 Base) MCG/ACT inhaler Inhale 2 puffs into the lungs every 6 (six) hours as needed for wheezing or shortness of breath.   allopurinol (ZYLOPRIM) 100 MG tablet Take 1 tablet (100 mg total) by mouth daily.   cetirizine (ZYRTEC) 10 MG tablet Take 10 mg by mouth daily.   esomeprazole (NEXIUM) 20 MG capsule Take 20 mg by mouth daily at 12 noon.   fluticasone (FLOVENT HFA) 110 MCG/ACT inhaler Inhale 2 puffs into the lungs in the morning and at bedtime.   metFORMIN (GLUCOPHAGE) 500 MG tablet Take 1 tablet (500 mg total) by mouth 2 (two) times daily with a meal.   Multiple Vitamins-Minerals (MENS MULTIVITAMIN) TABS Take 1 capsule by mouth daily. Unknown strength   olmesartan (BENICAR) 20 MG tablet Take 0.5 tablets (10 mg total) by mouth daily.   rosuvastatin (CRESTOR) 20 MG tablet Take 1 tablet (20 mg total) by mouth daily.   Vitamin D, Ergocalciferol, (DRISDOL) 1.25 MG (50000 UNIT) CAPS capsule Take 1 capsule (50,000 Units total) by mouth every 7 (seven) days.     Allergies:   Patient has no known  allergies.   Social History   Socioeconomic History   Marital status: Married    Spouse name: Not on file   Number of children: Not on file   Years of education: Not on file   Highest education level: Not on file  Occupational History   Occupation: Engineer, production  Tobacco Use   Smoking status: Never   Smokeless tobacco: Never  Vaping Use   Vaping Use: Never used  Substance and Sexual Activity   Alcohol use: No   Drug use: No   Sexual activity: Not on file  Other Topics Concern   Not on file  Social History Narrative   Not on file   Social Determinants of Health   Financial Resource Strain: Not on file  Food Insecurity: Not on file  Transportation Needs: Not on file  Physical  Activity: Not on file  Stress: Not on file  Social Connections: Not on file     Family History: The patient's family history is negative for Cancer. He was adopted. ROS:   Please see the history of present illness.    All 14 point review of systems negative except as described per history of present illness  EKGs/Labs/Other Studies Reviewed:      Recent Labs: 05/28/2020: Hemoglobin 16.5; Platelets 152; TSH 2.240 08/26/2020: ALT 23; BUN 17; Creatinine, Ser 1.15; Potassium 4.2; Sodium 143  Recent Lipid Panel    Component Value Date/Time   CHOL 104 08/26/2020 0854   CHOL 208 (H) 05/28/2020 1019   TRIG 59.0 08/26/2020 0854   HDL 35.90 (L) 08/26/2020 0854   HDL 43 05/28/2020 1019   CHOLHDL 3 08/26/2020 0854   VLDL 11.8 08/26/2020 0854   LDLCALC 56 08/26/2020 0854   LDLCALC 147 (H) 05/28/2020 1019    Physical Exam:    VS:  BP (!) 142/84 (BP Location: Right Arm, Patient Position: Sitting)   Pulse 67   Ht '5\' 9"'$  (1.753 m)   Wt 259 lb (117.5 kg)   SpO2 95%   BMI 38.25 kg/m     Wt Readings from Last 3 Encounters:  10/22/20 259 lb (117.5 kg)  10/11/20 261 lb 8 oz (118.6 kg)  09/09/20 261 lb (118.4 kg)     GEN:  Well nourished, well developed in no acute distress HEENT: Normal NECK: No JVD; No carotid bruits LYMPHATICS: No lymphadenopathy CARDIAC: RRR, no murmurs, no rubs, no gallops RESPIRATORY:  Clear to auscultation without rales, wheezing or rhonchi  ABDOMEN: Soft, non-tender, non-distended MUSCULOSKELETAL:  No edema; No deformity  SKIN: Warm and dry LOWER EXTREMITIES: no swelling NEUROLOGIC:  Alert and oriented x 3 PSYCHIATRIC:  Normal affect   ASSESSMENT:    1. Essential hypertension   2. OSA on CPAP   3. High cholesterol   4. SOB (shortness of breath)   5. Morbid obesity (HCC)    PLAN:    In order of problems listed above:  Essential hypertension blood pressure little elevated today but he check it at home is good Obstructive sleep apnea use CPAP mask  doing well from that point review very happy with results Dyslipidemia put him on high intensity statin he is on Crestor 20 which I will continue his K PN that I reviewed, show me LDL is 56 HDL 35.  We will continue that management. Morbid obesity he lost about 50 pounds I congratulated him I told him that now he need to step up his exercises.  He walks 3 times a week for about half  an hour with his wife however he said that he is able to talk quite a walk I told him we do intensify and he said he to be walking that he cannot carry regular conversation.  We did talk about different options including joining the gym Shortness of breath improving with weight management.  Continue doing this I encouraged patient to stick with the program. EKG changes which are minor nonspecific.  Echocardiogram did not show any segmental wall motion abnormality, does not have any symptomatology suggesting heart issue actively going on.  We will continue present management  Overall Travis Fields is doing very well.  I asked him to start taking 1 baby aspirin because of risk factors, I will see him back in about 1 year or sooner if he has a problem   Medication Adjustments/Labs and Tests Ordered: Current medicines are reviewed at length with the patient today.  Concerns regarding medicines are outlined above.  No orders of the defined types were placed in this encounter.  Medication changes: No orders of the defined types were placed in this encounter.   Signed, Park Liter, MD, [facility] 10/22/2020 8:21 AM    [facility]

## 2020-10-25 ENCOUNTER — Ambulatory Visit (INDEPENDENT_AMBULATORY_CARE_PROVIDER_SITE_OTHER): Payer: Self-pay | Admitting: Family Medicine

## 2020-10-25 ENCOUNTER — Other Ambulatory Visit: Payer: Self-pay

## 2020-10-25 ENCOUNTER — Encounter: Payer: Self-pay | Admitting: Family Medicine

## 2020-10-25 VITALS — BP 118/78 | HR 96 | Temp 98.0°F | Ht 69.0 in | Wt 259.0 lb

## 2020-10-25 DIAGNOSIS — Z0000 Encounter for general adult medical examination without abnormal findings: Secondary | ICD-10-CM

## 2020-10-25 NOTE — Patient Instructions (Addendum)
I recommend getting the flu shot in mid October. This suggestion would change if the CDC comes out with a different recommendation.   Keep the diet clean and stay active.  The new Shingrix vaccine (for shingles) is a 2 shot series. It can make people feel low energy, achy and almost like they have the flu for 48 hours after injection. Please plan accordingly when deciding on when to get this shot. Call our office for a nurse visit appointment to get this. The second shot of the series is less severe regarding the side effects, but it still lasts 48 hours.   Let us know if you need anything.

## 2020-10-25 NOTE — Progress Notes (Signed)
Chief Complaint  Patient presents with   Follow-up    Well Male Travis Fields is here for a complete physical.   His last physical was >1 year ago.  Current diet: in general, a "healthy" diet.  Current exercise: walking Weight trend: stable as of late but has lost quite a bit over the past year.  Fatigue out of ordinary? No. Seat belt? Yes.    Health maintenance Shingrix- No Colonoscopy- Yes Tetanus- Yes HIV- Yes Hep C- No   Past Medical History:  Diagnosis Date   Allergy    Asthma    Back pain    Bilateral swelling of feet    Chronic kidney disease    kidney stones    Essential hypertension 03/29/2017   GERD (gastroesophageal reflux disease)    dx'd with GERD 10- 12 yrs ago with EGD    Gout    Heartburn    High cholesterol    History of chicken pox    History of kidney stones    History of shingles    Hypertension    Joint pain    Morbid obesity ([place]) 12/05/2015   OSA on CPAP 12/05/2015   Right foot pain 06/07/2017   Seasonal allergies    Sleep apnea    wears cpap    SOB (shortness of breath)    Swallowing difficulty    Type 2 diabetes mellitus with hyperglycemia, without long-term current use of insulin ([place]) 05/29/2020      Past Surgical History:  Procedure Laterality Date   INNER EAR SURGERY     UPPER GASTROINTESTINAL ENDOSCOPY      Medications  Current Outpatient Medications on File Prior to Visit  Medication Sig Dispense Refill   albuterol (VENTOLIN HFA) 108 (90 Base) MCG/ACT inhaler Inhale 2 puffs into the lungs every 6 (six) hours as needed for wheezing or shortness of breath. 8 g 0   allopurinol (ZYLOPRIM) 100 MG tablet Take 1 tablet (100 mg total) by mouth daily. 30 tablet 6   cetirizine (ZYRTEC) 10 MG tablet Take 10 mg by mouth daily.     esomeprazole (NEXIUM) 20 MG capsule Take 20 mg by mouth daily at 12 noon.     fluticasone (FLOVENT HFA) 110 MCG/ACT inhaler Inhale 2 puffs into the lungs in the morning and at bedtime. 1 each 3   metFORMIN  (GLUCOPHAGE) 500 MG tablet Take 1 tablet (500 mg total) by mouth 2 (two) times daily with a meal. 180 tablet 2   Multiple Vitamins-Minerals (MENS MULTIVITAMIN) TABS Take 1 capsule by mouth daily. Unknown strength     olmesartan (BENICAR) 20 MG tablet Take 0.5 tablets (10 mg total) by mouth daily. 90 tablet 2   rosuvastatin (CRESTOR) 20 MG tablet Take 1 tablet (20 mg total) by mouth daily. 90 tablet 3   Vitamin D, Ergocalciferol, (DRISDOL) 1.25 MG (50000 UNIT) CAPS capsule Take 1 capsule (50,000 Units total) by mouth every 7 (seven) days. 4 capsule 0    Allergies No Known Allergies  Family History Family History  Adopted: Yes  Problem Relation Age of Onset   Cancer Neg Hx     Review of Systems: Constitutional:  no fevers Eye:  no recent significant change in vision Ear/Nose/Mouth/Throat:  Ears:  no hearing loss Nose/Mouth/Throat:  no complaints of nasal congestion, no sore throat Cardiovascular:  no chest pain Respiratory:  no shortness of breath Gastrointestinal:  no change in bowel habits GU:  Male: negative for dysuria, frequency Musculoskeletal/Extremities:  no joint pain  Integumentary (Skin/Breast):  no abnormal skin lesions reported Neurologic:  no headaches Endocrine: No unexpected weight changes Hematologic/Lymphatic:  no abnormal bleeding  Exam BP 118/78   Pulse 96   Temp 98 F (36.7 C) (Oral)   Ht '5\' 9"'$  (1.753 m)   Wt 259 lb (117.5 kg)   SpO2 98%   BMI 38.25 kg/m  General:  well developed, well nourished, in no apparent distress Skin:  no significant moles, warts, or growths Head:  no masses, lesions, or tenderness Eyes:  pupils equal and round, sclera anicteric without injection Ears:  canals without lesions, TMs shiny without retraction, no obvious effusion, no erythema Nose:  nares patent, septum midline, mucosa normal Throat/Pharynx:  lips and gingiva without lesion; tongue and uvula midline; non-inflamed pharynx; no exudates or postnasal drainage Neck:  neck supple without adenopathy, thyromegaly, or masses Cardiac: RRR, no bruits, no LE edema Lungs:  clear to auscultation, breath sounds equal bilaterally, no respiratory distress Abdomen: BS+, soft, non-tender, non-distended, no masses or organomegaly noted Rectal: Deferred Musculoskeletal:  symmetrical muscle groups noted without atrophy or deformity Neuro:  gait normal; deep tendon reflexes normal and symmetric Psych: well oriented with normal range of affect and appropriate judgment/insight  Assessment and Plan  Well adult exam   Well 55 y.o. male. Counseled on diet and exercise. Shingrix rec'd. Flu shot in Oct.  Will ck Hep C at next lab visit.  Will hold off on labs.  Follow up in 6 mo. The patient voiced understanding and agreement to the plan.  [place], DO 10/25/20 8:55 AM

## 2020-10-28 ENCOUNTER — Ambulatory Visit (INDEPENDENT_AMBULATORY_CARE_PROVIDER_SITE_OTHER): Payer: 59 | Admitting: Family Medicine

## 2020-10-28 ENCOUNTER — Other Ambulatory Visit: Payer: Self-pay

## 2020-10-28 ENCOUNTER — Encounter (INDEPENDENT_AMBULATORY_CARE_PROVIDER_SITE_OTHER): Payer: Self-pay | Admitting: Family Medicine

## 2020-10-28 VITALS — BP 114/74 | HR 65 | Temp 98.5°F | Ht 69.0 in | Wt 252.0 lb

## 2020-10-28 DIAGNOSIS — Z6841 Body Mass Index (BMI) 40.0 and over, adult: Secondary | ICD-10-CM

## 2020-10-28 DIAGNOSIS — Z9189 Other specified personal risk factors, not elsewhere classified: Secondary | ICD-10-CM | POA: Diagnosis not present

## 2020-10-28 DIAGNOSIS — E1159 Type 2 diabetes mellitus with other circulatory complications: Secondary | ICD-10-CM

## 2020-10-28 DIAGNOSIS — E559 Vitamin D deficiency, unspecified: Secondary | ICD-10-CM | POA: Diagnosis not present

## 2020-10-28 DIAGNOSIS — I152 Hypertension secondary to endocrine disorders: Secondary | ICD-10-CM | POA: Diagnosis not present

## 2020-10-28 MED ORDER — VITAMIN D (ERGOCALCIFEROL) 1.25 MG (50000 UNIT) PO CAPS: 50000.0000 [IU] | ORAL_CAPSULE | ORAL | 0 refills | Status: DC

## 2020-10-29 NOTE — Progress Notes (Signed)
Chief Complaint:   OBESITY Travis Fields is here to discuss his progress with his obesity treatment plan along with follow-up of his obesity related diagnoses. Travis Fields is on the Category 4 Plan and states he is following his eating plan approximately 90% of the time. Travis Fields states he is walking 4 miles 6-7 times a week and weight training 20 minutes 2 times per week.  Today's visit was #: 7 Starting weight: 303 lbs Starting date: 05/28/2020 Today's weight: 252 lbs Today's date: 10/28/2020 Total lbs lost to date: 51 Total lbs lost since last in-office visit: 6  Interim History: Travis Fields had a gout attack 1-2 days after his last appt. He voices he also got COVID a week after and took Paxlovid. Pt is going to [place] next week for 10 days. He is anticipating walking/hiking quite a bit while away. He has started some resistance training, which he would like to continue when returning home.  Subjective:   1. Hypertension associated with diabetes (Travis Fields) BP very well controlled. Pt denies chest pain/chest pressure/headache.  2. Vitamin D deficiency Travis Fields denies nausea, vomiting, and muscle weakness but notes fatigue. Pt is on prescription Vit D.  3. At risk for osteoporosis Travis Fields is at higher risk of osteopenia and osteoporosis due to Vitamin D deficiency.   Assessment/Plan:   1. Hypertension associated with diabetes (Travis Fields) Travis Fields is to discontinue olmesartan. He is working on healthy weight loss and exercise to improve blood pressure control. We will watch for signs of hypotension as he continues his lifestyle modifications.  2. Vitamin D deficiency Low Vitamin D level contributes to fatigue and are associated with obesity, breast, and colon cancer. He agrees to continue to take prescription Vitamin D 50,000 IU every week and will follow-up for routine testing of Vitamin D, at least 2-3 times per year to avoid over-replacement.  Refill- Vitamin D, Ergocalciferol, (DRISDOL) 1.25 MG (50000 UNIT)  CAPS capsule; Take 1 capsule (50,000 Units total) by mouth every 7 (seven) days.  Dispense: 4 capsule; Refill: 0  3. At risk for osteoporosis Travis Fields was given approximately 15 minutes of osteoporosis prevention counseling today. Travis Fields is at risk for osteopenia and osteoporosis due to his Vitamin D deficiency. He was encouraged to take his Vitamin D and follow his higher calcium diet and increase strengthening exercise to help strengthen his bones and decrease his risk of osteopenia and osteoporosis.  Repetitive spaced learning was employed today to elicit superior memory formation and behavioral change.  4. Obesity with current BMI of 37.3  Travis Fields is currently in the action stage of change. As such, his goal is to continue with weight loss efforts. He has agreed to the Category 4 Plan and practicing portion control and making smarter food choices, such as increasing vegetables and decreasing simple carbohydrates.   Exercise goals:  As is  Behavioral modification strategies: increasing lean protein intake, meal planning and cooking strategies, keeping healthy foods in the home, and planning for success.  Jensen has agreed to follow-up with our clinic in 3 weeks. He was informed of the importance of frequent follow-up visits to maximize his success with intensive lifestyle modifications for his multiple health conditions.   Objective:   Blood pressure 114/74, pulse 65, temperature 98.5 F (36.9 C), height '5\' 9"'$  (1.753 m), weight 252 lb (114.3 kg), SpO2 96 %. Body mass index is 37.21 kg/m.  General: Cooperative, alert, well developed, in no acute distress. HEENT: Conjunctivae and lids unremarkable. Cardiovascular: Regular rhythm.  Lungs: Normal work  of breathing. Neurologic: No focal deficits.   Lab Results  Component Value Date   CREATININE 1.15 08/26/2020   BUN 17 08/26/2020   NA 143 08/26/2020   K 4.2 08/26/2020   CL 106 08/26/2020   CO2 27 08/26/2020   Lab Results  Component  Value Date   ALT 23 08/26/2020   AST 23 08/26/2020   ALKPHOS 54 08/26/2020   BILITOT 0.9 08/26/2020   Lab Results  Component Value Date   HGBA1C 6.1 08/26/2020   HGBA1C 8.3 (H) 05/28/2020   HGBA1C 6.2 10/25/2017   HGBA1C 5.9 12/05/2015   Lab Results  Component Value Date   INSULIN 46.3 (H) 05/28/2020   Lab Results  Component Value Date   TSH 2.240 05/28/2020   Lab Results  Component Value Date   CHOL 104 08/26/2020   HDL 35.90 (L) 08/26/2020   LDLCALC 56 08/26/2020   TRIG 59.0 08/26/2020   CHOLHDL 3 08/26/2020   Lab Results  Component Value Date   VD25OH 30.1 05/28/2020   Lab Results  Component Value Date   WBC 6.9 05/28/2020   HGB 16.5 05/28/2020   HCT 49.8 05/28/2020   MCV 87 05/28/2020   PLT 152 05/28/2020    Attestation Statements:   Reviewed by clinician on day of visit: allergies, medications, problem list, medical history, surgical history, family history, social history, and previous encounter notes.  Coral Ceo, CMA, am acting as transcriptionist for Coralie Common, MD.   I have reviewed the above documentation for accuracy and completeness, and I agree with the above. - Coralie Common, MD

## 2020-11-01 ENCOUNTER — Other Ambulatory Visit: Payer: Self-pay

## 2020-11-01 ENCOUNTER — Telehealth (INDEPENDENT_AMBULATORY_CARE_PROVIDER_SITE_OTHER): Payer: 59 | Admitting: Family

## 2020-11-01 DIAGNOSIS — Z20822 Contact with and (suspected) exposure to covid-19: Secondary | ICD-10-CM

## 2020-11-01 NOTE — Progress Notes (Signed)
Travis Fields is a 55 y.o. male with the following history as recorded in EpicCare:  Patient Active Problem List   Diagnosis Date Noted   Nonspecific abnormal electrocardiogram (ECG) (EKG) 06/11/2020   Swallowing difficulty    SOB (shortness of breath)    Sleep apnea    Seasonal allergies    Joint pain    Hypertension    History of kidney stones    History of chicken pox    High cholesterol    Heartburn    Chronic kidney disease    Bilateral swelling of feet    Back pain    Allergy    Type 2 diabetes mellitus with hyperglycemia, without long-term current use of insulin ([place]) 05/29/2020   Right foot pain 06/07/2017   Essential hypertension 03/29/2017   Morbid obesity ([place]) 12/05/2015   GERD (gastroesophageal reflux disease) 12/05/2015   OSA on CPAP 12/05/2015    Current Outpatient Medications  Medication Sig Dispense Refill   albuterol (VENTOLIN HFA) 108 (90 Base) MCG/ACT inhaler Inhale 2 puffs into the lungs every 6 (six) hours as needed for wheezing or shortness of breath. 8 g 0   allopurinol (ZYLOPRIM) 100 MG tablet Take 1 tablet (100 mg total) by mouth daily. 30 tablet 6   cetirizine (ZYRTEC) 10 MG tablet Take 10 mg by mouth daily.     esomeprazole (NEXIUM) 20 MG capsule Take 20 mg by mouth daily at 12 noon.     fluticasone (FLOVENT HFA) 110 MCG/ACT inhaler Inhale 2 puffs into the lungs in the morning and at bedtime. 1 each 3   metFORMIN (GLUCOPHAGE) 500 MG tablet Take 1 tablet (500 mg total) by mouth 2 (two) times daily with a meal. 180 tablet 2   Multiple Vitamins-Minerals (MENS MULTIVITAMIN) TABS Take 1 capsule by mouth daily. Unknown strength     olmesartan (BENICAR) 20 MG tablet Take 0.5 tablets (10 mg total) by mouth daily. 90 tablet 2   rosuvastatin (CRESTOR) 20 MG tablet Take 1 tablet (20 mg total) by mouth daily. 90 tablet 3   Vitamin D, Ergocalciferol, (DRISDOL) 1.25 MG (50000 UNIT) CAPS capsule Take 1 capsule (50,000 Units total) by mouth every 7 (seven) days. 4  capsule 0   No current facility-administered medications for this visit.    Allergies: Patient has no known allergies.  Past Medical History:  Diagnosis Date   Allergy    Asthma    Back pain    Bilateral swelling of feet    Chronic kidney disease    kidney stones    Essential hypertension 03/29/2017   GERD (gastroesophageal reflux disease)    dx'd with GERD 10- 12 yrs ago with EGD    Gout    Heartburn    High cholesterol    History of chicken pox    History of kidney stones    History of shingles    Hypertension    Joint pain    Morbid obesity ([place]) 12/05/2015   OSA on CPAP 12/05/2015   Right foot pain 06/07/2017   Seasonal allergies    Sleep apnea    wears cpap    SOB (shortness of breath)    Swallowing difficulty    Type 2 diabetes mellitus with hyperglycemia, without long-term current use of insulin ([place]) 05/29/2020    Past Surgical History:  Procedure Laterality Date   INNER EAR SURGERY     UPPER GASTROINTESTINAL ENDOSCOPY      Family History  Adopted: Yes  Problem Relation Age of  Onset   Cancer Neg Hx     Social History   Tobacco Use   Smoking status: Never   Smokeless tobacco: Never  Substance Use Topics   Alcohol use: No    Subjective:   I connected with Candice Camp on 11/01/20 at  2:20 PM EDT by a video enabled telemedicine application and verified that I am speaking with the correct person using two identifiers.   I discussed the limitations of evaluation and management by telemedicine and the availability of in person appointments. The patient expressed understanding and agreed to proceed. Provider in office/ patient is at home; provider and patient are only 2 people on video call.   Patient and his wife both had COVID earlier this month- have recovered with no complications; his son tested positive yesterday for COVID and in general, has very mild symptoms and is doing well; They are leaving for [place] on Tuesday and patient's elderly parents will  traveling with them; patient is wanting to review/ discuss concerns for quarantine in order to keep everyone healthy and ensure as safe a trip as possible.      Objective:  There were no vitals filed for this visit.  General: Well developed, well nourished, in no acute distress  Skin : Warm and dry.  Head: Normocephalic and atraumatic  Lungs: Respirations unlabored;  Neurologic: Alert and oriented; speech intact; face symmetrical;   Assessment:  1. Close exposure to COVID-19 virus     Plan:  Reviewed current guidelines and discussed plans that family already has in place; son will plan to test on Monday as this will be his day 5; agree that family is taking appropriate and necessary steps to keep family safe; best wishes offered for safe and wonderful trip.    No follow-ups on file.  No orders of the defined types were placed in this encounter.   Requested Prescriptions    No prescriptions requested or ordered in this encounter

## 2020-11-20 ENCOUNTER — Encounter (INDEPENDENT_AMBULATORY_CARE_PROVIDER_SITE_OTHER): Payer: Self-pay | Admitting: Family Medicine

## 2020-11-20 ENCOUNTER — Other Ambulatory Visit: Payer: Self-pay

## 2020-11-20 ENCOUNTER — Ambulatory Visit (INDEPENDENT_AMBULATORY_CARE_PROVIDER_SITE_OTHER): Payer: 59 | Admitting: Family Medicine

## 2020-11-20 VITALS — BP 149/87 | HR 68 | Temp 98.3°F | Ht 69.0 in | Wt 255.0 lb

## 2020-11-20 DIAGNOSIS — E1159 Type 2 diabetes mellitus with other circulatory complications: Secondary | ICD-10-CM

## 2020-11-20 DIAGNOSIS — I152 Hypertension secondary to endocrine disorders: Secondary | ICD-10-CM

## 2020-11-20 DIAGNOSIS — Z9189 Other specified personal risk factors, not elsewhere classified: Secondary | ICD-10-CM | POA: Diagnosis not present

## 2020-11-20 DIAGNOSIS — Z6841 Body Mass Index (BMI) 40.0 and over, adult: Secondary | ICD-10-CM

## 2020-11-20 DIAGNOSIS — E559 Vitamin D deficiency, unspecified: Secondary | ICD-10-CM | POA: Diagnosis not present

## 2020-11-20 MED ORDER — VITAMIN D (ERGOCALCIFEROL) 1.25 MG (50000 UNIT) PO CAPS: 50000.0000 [IU] | ORAL_CAPSULE | ORAL | 0 refills | Status: DC

## 2020-11-21 NOTE — Progress Notes (Signed)
Chief Complaint:   OBESITY Travis Fields is here to discuss his progress with his obesity treatment plan along with follow-up of his obesity related diagnoses. Travis Fields is on the Category 4 Plan and practicing portion control and making smarter food choices, such as increasing vegetables and decreasing simple carbohydrates and states he is following his eating plan approximately 40% of the time. Travis Fields states he is walking 4 miles 7 times per week.  Today's visit was #: 8 Starting weight: 303 lbs Starting date: 05/28/2020 Today's weight: 255 lbs Today's date: 11/20/2020 Total lbs lost to date: 48 Total lbs lost since last in-office visit: 0  Interim History: Travis Fields returned from a trip to [place] over the last few weeks. He reports minimal travel issues. Pt wants to get back on category 4 and has already gone grocery shopping.  Subjective:   1. Vitamin D deficiency Travis Fields denies nausea, vomiting, and muscle weakness but notes fatigue. He is on prescription Vit D. His last Vit D level was 30.1.  2. Hypertension associated with diabetes (Travis Fields) BP slightly elevated today. Pt denies chest pain/chest pressure/headache.  3. At risk for osteoporosis Travis Fields is at higher risk of osteopenia and osteoporosis due to Vitamin D deficiency.   Assessment/Plan:   1. Vitamin D deficiency Low Vitamin D level contributes to fatigue and are associated with obesity, breast, and colon cancer. He agrees to continue to take prescription Vitamin D 50,000 IU every week and will follow-up for routine testing of Vitamin D, at least 2-3 times per year to avoid over-replacement.  Refill- Vitamin D, Ergocalciferol, (DRISDOL) 1.25 MG (50000 UNIT) CAPS capsule; Take 1 capsule (50,000 Units total) by mouth every 7 (seven) days.  Dispense: 4 capsule; Refill: 0  2. Hypertension associated with diabetes (Travis Fields) Travis Fields is working on healthy weight loss and exercise to improve blood pressure control. We will watch for signs of  hypotension as he continues his lifestyle modifications. Follow up BP at next appt with no change in meds at this time.  3. At risk for osteoporosis Travis Fields was given approximately 15 minutes of osteoporosis prevention counseling today. Travis Fields is at risk for osteopenia and osteoporosis due to his Vitamin D deficiency. He was encouraged to take his Vitamin D and follow his higher calcium diet and increase strengthening exercise to help strengthen his bones and decrease his risk of osteopenia and osteoporosis.  Repetitive spaced learning was employed today to elicit superior memory formation and behavioral change.  4. Obesity with current BMI of 37.7  Travis Fields is currently in the action stage of change. As such, his goal is to continue with weight loss efforts. He has agreed to the Category 4 Plan.   Exercise goals: All adults should avoid inactivity. Some physical activity is better than none, and adults who participate in any amount of physical activity gain some health benefits.- Add some resistance training.  Behavioral modification strategies: increasing lean protein intake, meal planning and cooking strategies, and planning for success.  Travis Fields has agreed to follow-up with our clinic in 3 weeks. He was informed of the importance of frequent follow-up visits to maximize his success with intensive lifestyle modifications for his multiple health conditions.   Objective:   Blood pressure (!) 149/87, pulse 68, temperature 98.3 F (36.8 C), height '5\' 9"'$  (1.753 m), weight 255 lb (115.7 kg), SpO2 97 %. Body mass index is 37.66 kg/m.  General: Cooperative, alert, well developed, in no acute distress. HEENT: Conjunctivae and lids unremarkable. Cardiovascular: Regular rhythm.  Lungs: Normal work of breathing. Neurologic: No focal deficits.   Lab Results  Component Value Date   CREATININE 1.15 08/26/2020   BUN 17 08/26/2020   NA 143 08/26/2020   K 4.2 08/26/2020   CL 106 08/26/2020   CO2 27  08/26/2020   Lab Results  Component Value Date   ALT 23 08/26/2020   AST 23 08/26/2020   ALKPHOS 54 08/26/2020   BILITOT 0.9 08/26/2020   Lab Results  Component Value Date   HGBA1C 6.1 08/26/2020   HGBA1C 8.3 (H) 05/28/2020   HGBA1C 6.2 10/25/2017   HGBA1C 5.9 12/05/2015   Lab Results  Component Value Date   INSULIN 46.3 (H) 05/28/2020   Lab Results  Component Value Date   TSH 2.240 05/28/2020   Lab Results  Component Value Date   CHOL 104 08/26/2020   HDL 35.90 (L) 08/26/2020   LDLCALC 56 08/26/2020   TRIG 59.0 08/26/2020   CHOLHDL 3 08/26/2020   Lab Results  Component Value Date   VD25OH 30.1 05/28/2020   Lab Results  Component Value Date   WBC 6.9 05/28/2020   HGB 16.5 05/28/2020   HCT 49.8 05/28/2020   MCV 87 05/28/2020   PLT 152 05/28/2020   Attestation Statements:   Reviewed by clinician on day of visit: allergies, medications, problem list, medical history, surgical history, family history, social history, and previous encounter notes.  Coral Ceo, CMA, am acting as transcriptionist for Coralie Common, MD.   I have reviewed the above documentation for accuracy and completeness, and I agree with the above. - Coralie Common, MD

## 2020-12-02 ENCOUNTER — Encounter (INDEPENDENT_AMBULATORY_CARE_PROVIDER_SITE_OTHER): Payer: Self-pay

## 2020-12-11 ENCOUNTER — Ambulatory Visit (INDEPENDENT_AMBULATORY_CARE_PROVIDER_SITE_OTHER): Payer: 59 | Admitting: Family Medicine

## 2020-12-18 ENCOUNTER — Ambulatory Visit (INDEPENDENT_AMBULATORY_CARE_PROVIDER_SITE_OTHER): Payer: 59 | Admitting: Family Medicine

## 2020-12-31 ENCOUNTER — Ambulatory Visit (INDEPENDENT_AMBULATORY_CARE_PROVIDER_SITE_OTHER): Payer: 59 | Admitting: Family Medicine

## 2021-02-26 ENCOUNTER — Encounter: Payer: PRIVATE HEALTH INSURANCE | Admitting: Family Medicine

## 2021-05-13 ENCOUNTER — Other Ambulatory Visit: Payer: Self-pay | Admitting: Family Medicine

## 2021-05-13 DIAGNOSIS — M109 Gout, unspecified: Secondary | ICD-10-CM

## 2021-05-13 MED ORDER — ALLOPURINOL 100 MG PO TABS: 100.0000 mg | ORAL_TABLET | Freq: Every day | ORAL | 6 refills | Status: DC

## 2021-07-08 IMAGING — US US EXTREM LOW VENOUS*L*
1 series · 13 of 24 positions shown · non-contrast
Comparison: None.

CLINICAL DATA: Left leg pain for 2 days.



[Series 1: us extrem low venous*left* · 13 of 27 slices shown]
[im 1/27]
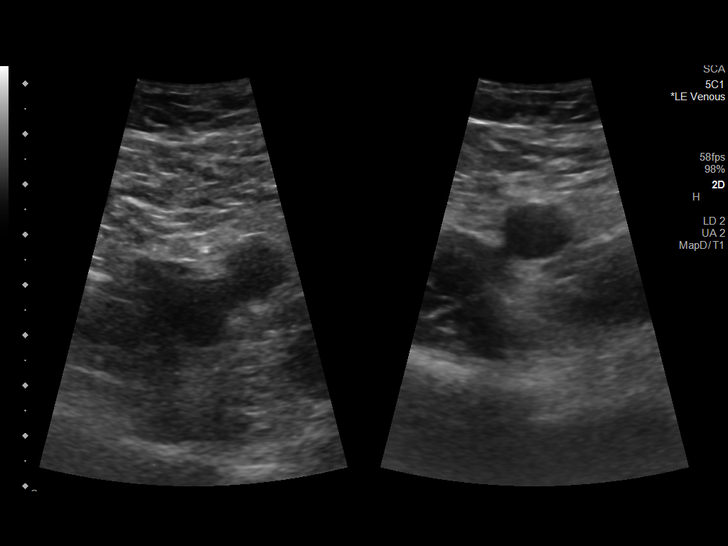
[im 3/27]
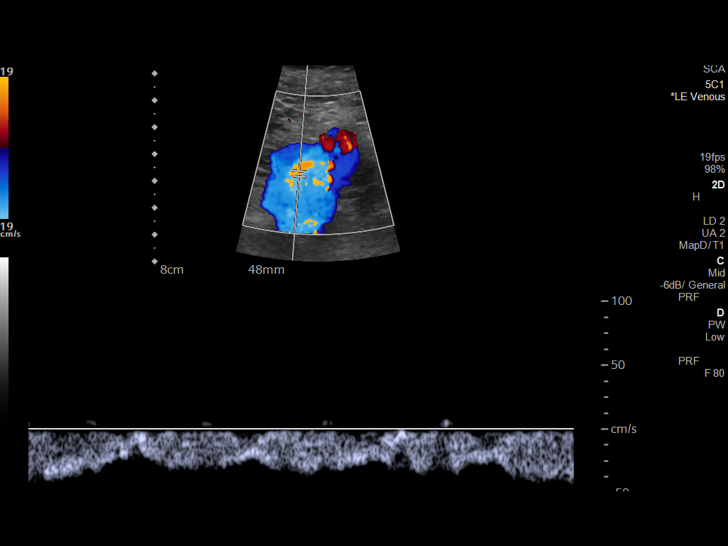
[im 5/27]
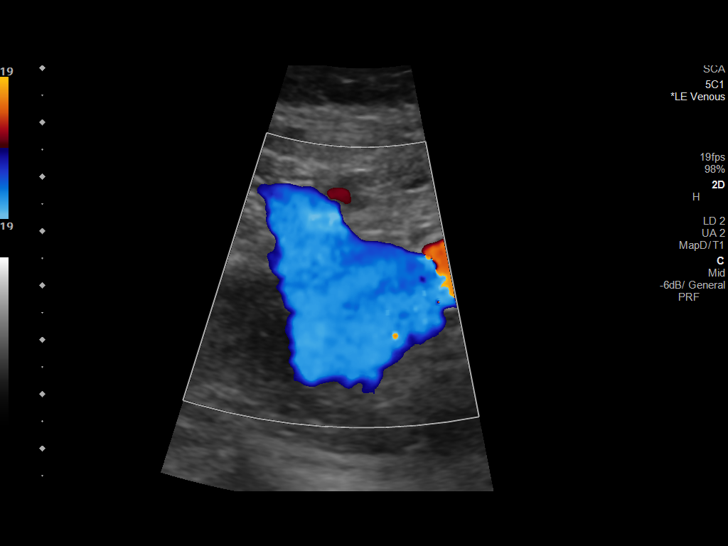
[im 7/27]
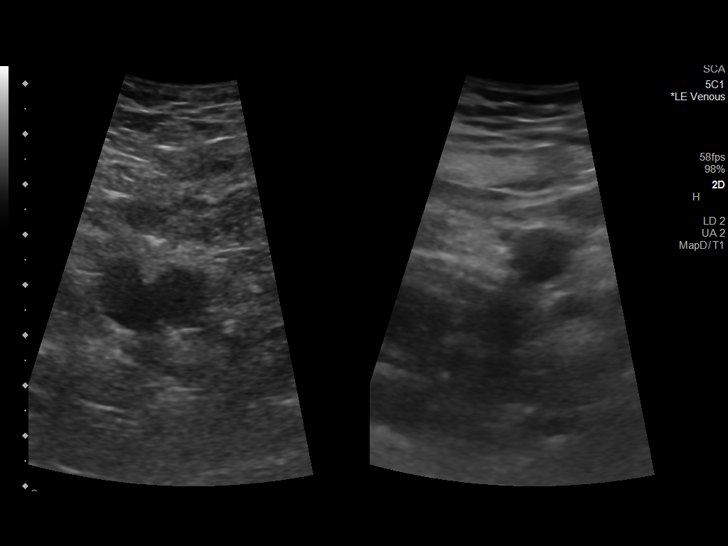
[im 10/27]
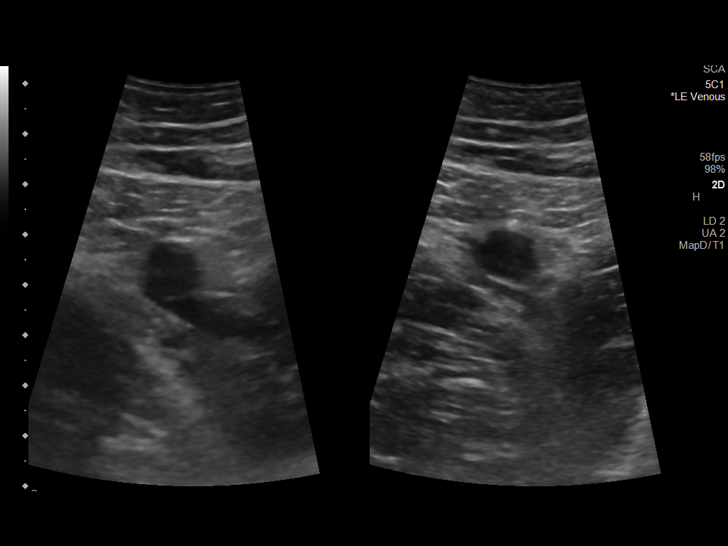
[im 12/27]
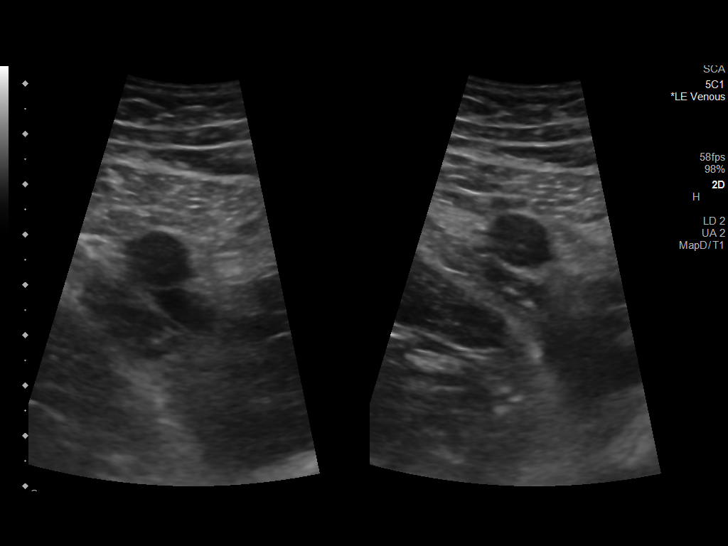
[im 14/27]
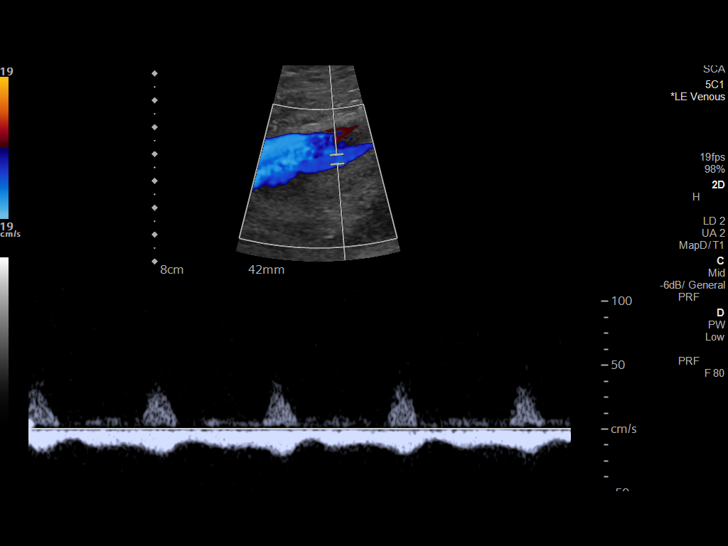
[im 15/27]
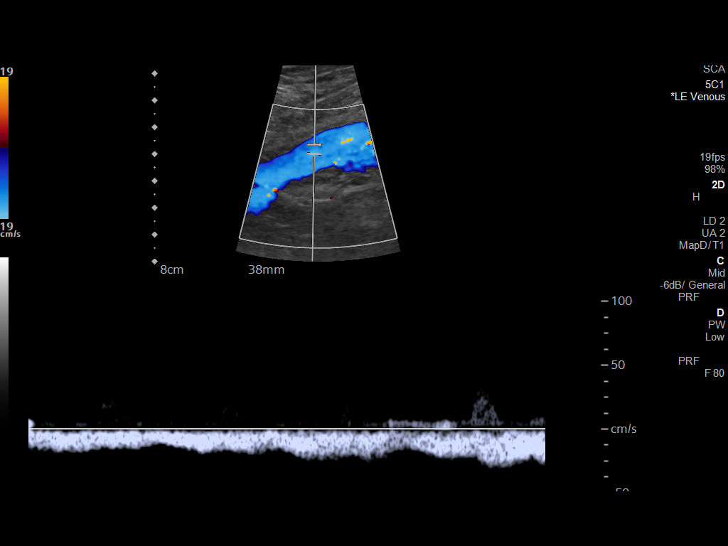
[im 17/27]
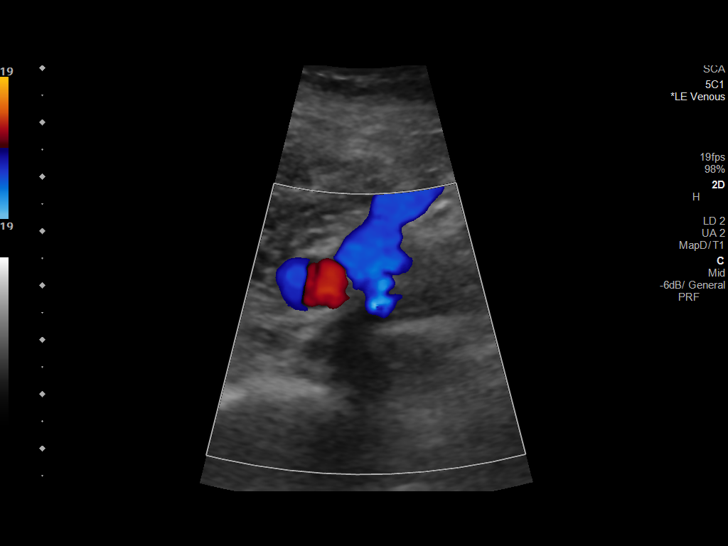
[im 20/27]
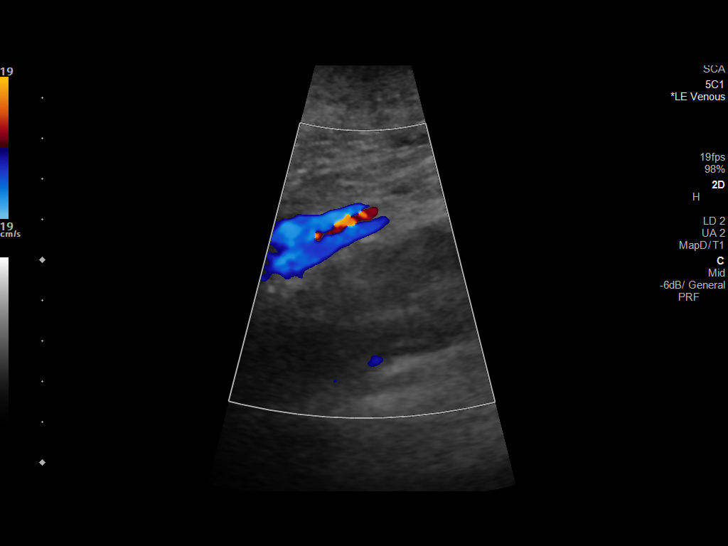
[im 22/27]
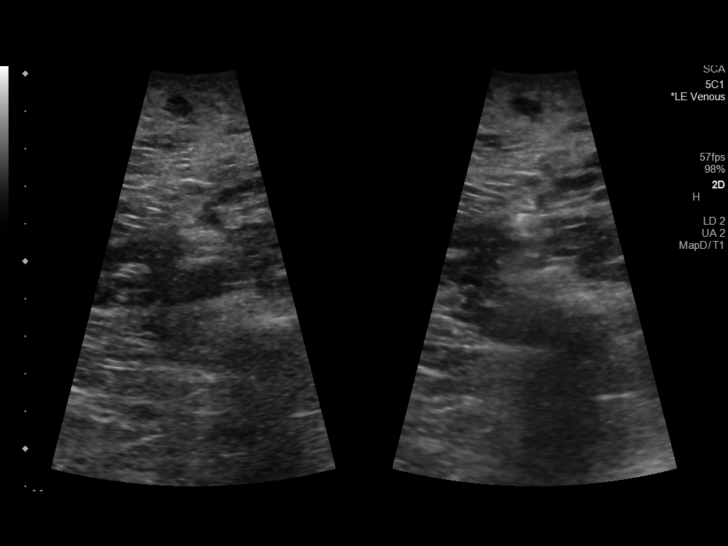
[im 24/27]
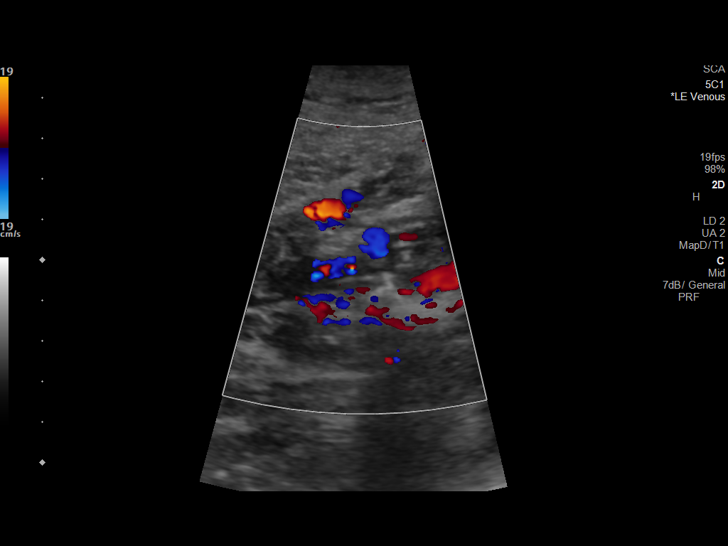
[im 27/27]
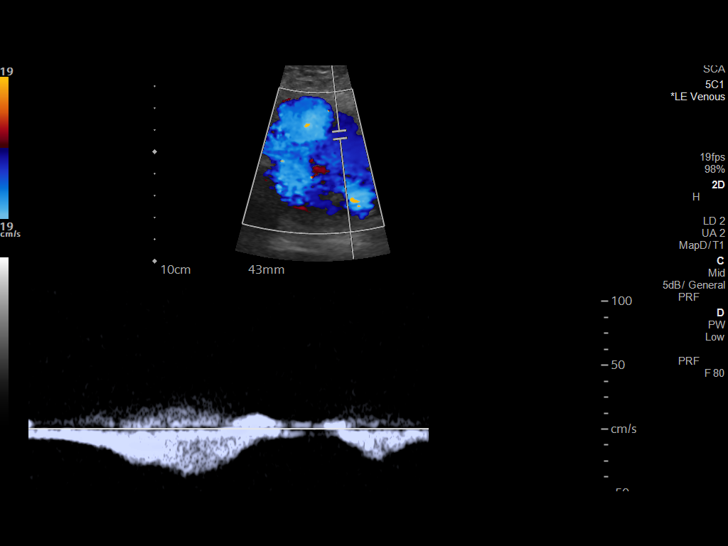

[13 of 24 positions shown; findings below may reference images not displayed]

FINDINGS: Contralateral Common Femoral Vein: Respiratory phasicity is normal
and symmetric with the symptomatic side. No evidence of thrombus.
Normal compressibility.

Common Femoral Vein: No evidence of thrombus. Normal
compressibility, respiratory phasicity and response to augmentation.

Saphenofemoral Junction: No evidence of thrombus. Normal
compressibility and flow on color Doppler imaging.

Profunda Femoral Vein: No evidence of thrombus. Normal
compressibility and flow on color Doppler imaging.

Femoral Vein: No evidence of thrombus. Normal compressibility,
respiratory phasicity and response to augmentation.

Popliteal Vein: No evidence of thrombus. Normal compressibility,
respiratory phasicity and response to augmentation.

Calf Veins: No evidence of thrombus. Normal compressibility and flow
on color Doppler imaging.

Superficial Great Saphenous Vein: No evidence of thrombus. Normal
compressibility.

Venous Reflux:  None.

Other Findings:  None.
IMPRESSION: No evidence of left lower extremity deep venous thrombosis.

## 2021-08-11 ENCOUNTER — Other Ambulatory Visit: Payer: Self-pay | Admitting: Family Medicine

## 2021-08-11 DIAGNOSIS — E1165 Type 2 diabetes mellitus with hyperglycemia: Secondary | ICD-10-CM

## 2021-09-15 ENCOUNTER — Other Ambulatory Visit: Payer: Self-pay | Admitting: Family Medicine

## 2021-09-15 DIAGNOSIS — E1165 Type 2 diabetes mellitus with hyperglycemia: Secondary | ICD-10-CM

## 2021-09-15 MED ORDER — METFORMIN HCL 500 MG PO TABS
500.0000 mg | ORAL_TABLET | Freq: Two times a day (BID) | ORAL | 0 refills | Status: DC
Start: 2021-09-15 — End: 2021-12-11

## 2021-10-22 ENCOUNTER — Encounter (INDEPENDENT_AMBULATORY_CARE_PROVIDER_SITE_OTHER): Payer: Self-pay

## 2021-12-09 ENCOUNTER — Other Ambulatory Visit: Payer: Self-pay | Admitting: Family Medicine

## 2021-12-09 DIAGNOSIS — M109 Gout, unspecified: Secondary | ICD-10-CM

## 2021-12-11 ENCOUNTER — Other Ambulatory Visit: Payer: Self-pay | Admitting: Family Medicine

## 2021-12-11 DIAGNOSIS — E1165 Type 2 diabetes mellitus with hyperglycemia: Secondary | ICD-10-CM

## 2022-03-17 ENCOUNTER — Other Ambulatory Visit: Payer: Self-pay | Admitting: Family Medicine

## 2022-03-17 DIAGNOSIS — E1165 Type 2 diabetes mellitus with hyperglycemia: Secondary | ICD-10-CM

## 2022-06-14 ENCOUNTER — Other Ambulatory Visit: Payer: Self-pay | Admitting: Family Medicine

## 2022-06-14 DIAGNOSIS — E1165 Type 2 diabetes mellitus with hyperglycemia: Secondary | ICD-10-CM

## 2022-07-10 ENCOUNTER — Other Ambulatory Visit (INDEPENDENT_AMBULATORY_CARE_PROVIDER_SITE_OTHER): Payer: 59

## 2022-07-10 ENCOUNTER — Encounter: Payer: Self-pay | Admitting: Family Medicine

## 2022-07-10 ENCOUNTER — Ambulatory Visit (INDEPENDENT_AMBULATORY_CARE_PROVIDER_SITE_OTHER): Payer: 59 | Admitting: Family Medicine

## 2022-07-10 ENCOUNTER — Other Ambulatory Visit: Payer: Self-pay | Admitting: Family Medicine

## 2022-07-10 VITALS — BP 162/98 | HR 70 | Temp 98.5°F | Ht 69.0 in | Wt 289.4 lb

## 2022-07-10 DIAGNOSIS — M5412 Radiculopathy, cervical region: Secondary | ICD-10-CM | POA: Diagnosis not present

## 2022-07-10 DIAGNOSIS — E119 Type 2 diabetes mellitus without complications: Secondary | ICD-10-CM

## 2022-07-10 DIAGNOSIS — E1165 Type 2 diabetes mellitus with hyperglycemia: Secondary | ICD-10-CM

## 2022-07-10 DIAGNOSIS — E1159 Type 2 diabetes mellitus with other circulatory complications: Secondary | ICD-10-CM | POA: Diagnosis not present

## 2022-07-10 DIAGNOSIS — M109 Gout, unspecified: Secondary | ICD-10-CM

## 2022-07-10 DIAGNOSIS — I152 Hypertension secondary to endocrine disorders: Secondary | ICD-10-CM

## 2022-07-10 DIAGNOSIS — D582 Other hemoglobinopathies: Secondary | ICD-10-CM

## 2022-07-10 DIAGNOSIS — Z7984 Long term (current) use of oral hypoglycemic drugs: Secondary | ICD-10-CM

## 2022-07-10 LAB — CBC
HCT: 51.4 % (ref 39.0–52.0)
Hemoglobin: 17.2 g/dL — ABNORMAL HIGH (ref 13.0–17.0)
MCHC: 33.6 g/dL (ref 30.0–36.0)
MCV: 87.7 fl (ref 78.0–100.0)
Platelets: 123 10*3/uL — ABNORMAL LOW (ref 150.0–400.0)
RBC: 5.86 Mil/uL — ABNORMAL HIGH (ref 4.22–5.81)
RDW: 13.9 % (ref 11.5–15.5)
WBC: 9 10*3/uL (ref 4.0–10.5)

## 2022-07-10 LAB — LIPID PANEL
Cholesterol: 121 mg/dL (ref 0–200)
HDL: 40.4 mg/dL (ref >39.00)
LDL Cholesterol: 54 mg/dL (ref 0–99)
NonHDL: 81.01
Total CHOL/HDL Ratio: 3
Triglycerides: 133 mg/dL (ref 0.0–149.0)
VLDL: 26.6 mg/dL (ref 0.0–40.0)

## 2022-07-10 LAB — HEMOGLOBIN A1C: Hgb A1c MFr Bld: 6 % (ref 4.6–6.5)

## 2022-07-10 LAB — MICROALBUMIN / CREATININE URINE RATIO
Creatinine,U: 143.2 mg/dL
Microalb Creat Ratio: 4.8 mg/g (ref 0.0–30.0)
Microalb, Ur: 6.8 mg/dL — ABNORMAL HIGH (ref 0.0–1.9)

## 2022-07-10 LAB — COMPREHENSIVE METABOLIC PANEL
ALT: 34 U/L (ref 0–53)
AST: 28 U/L (ref 0–37)
Albumin: 4.7 g/dL (ref 3.5–5.2)
Alkaline Phosphatase: 78 U/L (ref 39–117)
BUN: 22 mg/dL (ref 6–23)
CO2: 27 mEq/L (ref 19–32)
Calcium: 10 mg/dL (ref 8.4–10.5)
Chloride: 103 mEq/L (ref 96–112)
Creatinine, Ser: 1.08 mg/dL (ref 0.40–1.50)
GFR: 77.04 mL/min (ref >60.00)
Glucose, Bld: 113 mg/dL — ABNORMAL HIGH (ref 70–99)
Potassium: 4.2 mEq/L (ref 3.5–5.1)
Sodium: 140 mEq/L (ref 135–145)
Total Bilirubin: 0.9 mg/dL (ref 0.2–1.2)
Total Protein: 7.7 g/dL (ref 6.0–8.3)

## 2022-07-10 LAB — URIC ACID: Uric Acid, Serum: 6.4 mg/dL (ref 4.0–7.8)

## 2022-07-10 MED ORDER — OLMESARTAN MEDOXOMIL 20 MG PO TABS: 20.0000 mg | ORAL_TABLET | Freq: Every day | ORAL | 2 refills | Status: DC

## 2022-07-10 NOTE — Progress Notes (Signed)
Musculoskeletal Exam  Patient: Travis Fields DOB: 03-03-1966  DOS: 07/10/2022  SUBJECTIVE:  Chief Complaint:   Chief Complaint  Patient presents with   Follow-up    UC    Arm Pain    Left     Travis Fields is a 57 y.o.  male for evaluation and treatment of L arm pain.   Onset:  1 week ago. No inj or change in activity.  Location: triceps Character:   squeezing   Progression of issue:  has worsened Associated symptoms: pain when he has his arm down Gets better when he raises his arm Treatment: to date has been OTC NSAIDS and oral steroids.   Neurovascular symptoms: no  Hypertension Patient presents for hypertension follow up. He does not monitor home blood pressures. He is not currently on any meds.  He is sometimes adhering to a healthy diet overall. Exercise: some walking  No CP or SOB.   DM Hx of DM, does not monitor sugars. Last A1c was >1 yr ago. Gained 30 lbs. compliant with metformin 500 mg twice daily.  No hypoglycemic symptoms reported.  He is also compliant with the Crestor.  Past Medical History:  Diagnosis Date   Allergy    Asthma    Back pain    Bilateral swelling of feet    Chronic kidney disease    kidney stones    Essential hypertension 03/29/2017   GERD (gastroesophageal reflux disease)    dx'd with GERD 10- 12 yrs ago with EGD    Gout    Heartburn    High cholesterol    History of chicken pox    History of kidney stones    History of shingles    Hypertension    Joint pain    Morbid obesity (HCC) 12/05/2015   OSA on CPAP 12/05/2015   Right foot pain 06/07/2017   Seasonal allergies    Sleep apnea    wears cpap    SOB (shortness of breath)    Swallowing difficulty    Type 2 diabetes mellitus with hyperglycemia, without long-term current use of insulin (HCC) 05/29/2020    Objective: VITAL SIGNS: BP (!) 162/98 (BP Location: Left Arm, Cuff Size: Large)   Pulse 70   Temp 98.5 F (36.9 C) (Oral)   Ht 5\' 9"  (1.753 m)   Wt 289 lb 6 oz  (131.3 kg)   SpO2 97%   BMI 42.73 kg/m  Constitutional: Well formed, well developed. No acute distress. Thorax & Lungs: No accessory muscle use Musculoskeletal: Neck/shoulder.   Normal active range of motion: yes.   Normal passive range of motion: yes Tenderness to palpation: Mild TTP over the lateral neck, trapezius, and triceps on the left Deformity: no Ecchymosis: no Tests positive: Spurling's on the left Tests negative: Neer's, crossover, liftoff, empty can, Hawkins, speeds Neurologic: Normal sensory function. No focal deficits noted. DTR's equal and symmetric in UE's/LE's.  5/5 strength throughout, gait is normal.  No clonus. Psychiatric: Normal mood. Age appropriate judgment and insight. Alert & oriented x 3.    Assessment:  Cervical radiculopathy - Plan: Ambulatory referral to Physical Therapy  Hypertension associated with diabetes (HCC) - Plan: olmesartan (BENICAR) 20 MG tablet  Type 2 diabetes mellitus with hyperglycemia, without long-term current use of insulin (HCC) - Plan: CBC, Comprehensive metabolic panel, Lipid panel, Microalbumin / creatinine urine ratio  Gout of multiple sites, unspecified cause, unspecified chronicity - Plan: Uric acid  Diabetes mellitus treated with oral medication (HCC)  Plan: Refer  to physical therapy.  No signs of myelopathy.  Stretches/exercises, heat, ice, Tylenol.  Finished Medrol Dosepak. Chronic, uncontrolled.  Restart olmesartan 20 mg daily.  Monitor blood pressure at home.  Counseled on diet and exercise.  Follow-up in 1 month. Chronic, hopefully stable.  Continue metformin 500 mg twice daily.  Check above labs. This is well-controlled.  Continue allopurinol 100 mg daily.  Check uric acid. The patient voiced understanding and agreement to the plan.   Jilda Roche [place], DO 07/10/22  12:03 PM

## 2022-07-10 NOTE — Patient Instructions (Signed)
Heat (pad or rice pillow in microwave) over affected area, 10-15 minutes twice daily.   If you do not hear anything about your referral in the next 1-2 weeks, call our office and ask for an update.  Give [place] 2-3 business days to get the results of your labs back.   Keep the diet clean and stay active.  Aim to do some physical exertion for 150 minutes per week. This is typically divided into 5 days per week, 30 minutes per day. The activity should be enough to get your heart rate up. Anything is better than nothing if you have time constraints.  Let us know if you need anything.  EXERCISES RANGE OF MOTION (ROM) AND STRETCHING EXERCISES  These exercises may help you when beginning to rehabilitate your issue. In order to successfully resolve your symptoms, you must improve your posture. These exercises are designed to help reduce the forward-head and rounded-shoulder posture which contributes to this condition. Your symptoms may resolve with or without further involvement from your physician, physical therapist or athletic trainer. While completing these exercises, remember:  Restoring tissue flexibility helps normal motion to return to the joints. This allows healthier, less painful movement and activity. An effective stretch should be held for at least 20 seconds, although you may need to begin with shorter hold times for comfort. A stretch should never be painful. You should only feel a gentle lengthening or release in the stretched tissue. Do not do any stretch or exercise that you cannot tolerate.  STRETCH- Axial Extensors Lie on your back on the floor. You may bend your knees for comfort. Place a rolled-up hand towel or dish towel, about 2 inches in diameter, under the part of your head that makes contact with the floor. Gently tuck your chin, as if trying to make a "double chin," until you feel a gentle stretch at the base of your head. Hold 15-20 seconds. Repeat 2-3 times. Complete this  exercise 1 time per day.   STRETCH - Axial Extension  Stand or sit on a firm surface. Assume a good posture: chest up, shoulders drawn back, abdominal muscles slightly tense, knees unlocked (if standing) and feet hip width apart. Slowly retract your chin so your head slides back and your chin slightly lowers. Continue to look straight ahead. You should feel a gentle stretch in the back of your head. Be certain not to feel an aggressive stretch since this can cause headaches later. Hold for 15-20 seconds. Repeat 2-3 times. Complete this exercise 1 time per day.  STRETCH - Cervical Side Bend  Stand or sit on a firm surface. Assume a good posture: chest up, shoulders drawn back, abdominal muscles slightly tense, knees unlocked (if standing) and feet hip width apart. Without letting your nose or shoulders move, slowly tip your right / left ear to your shoulder until your feel a gentle stretch in the muscles on the opposite side of your neck. Hold 15-20 seconds. Repeat 2-3 times. Complete this exercise 1-2 times per day.  STRETCH - Cervical Rotators  Stand or sit on a firm surface. Assume a good posture: chest up, shoulders drawn back, abdominal muscles slightly tense, knees unlocked (if standing) and feet hip width apart. Keeping your eyes level with the ground, slowly turn your head until you feel a gentle stretch along the back and opposite side of your neck. Hold 15-20 seconds. Repeat 2-3 times. Complete this exercise 1-2 times per day.  RANGE OF MOTION - Neck Circles  Stand or sit on a firm surface. Assume a good posture: chest up, shoulders drawn back, abdominal muscles slightly tense, knees unlocked (if standing) and feet hip width apart. Gently roll your head down and around from the back of one shoulder to the back of the other. The motion should never be forced or painful. Repeat the motion 10-20 times, or until you feel the neck muscles relax and loosen. Repeat 2-3 times. Complete the  exercise 1-2 times per day. STRENGTHENING EXERCISES - Cervical Strain and Sprain These exercises may help you when beginning to rehabilitate your injury. They may resolve your symptoms with or without further involvement from your physician, physical therapist, or athletic trainer. While completing these exercises, remember:  Muscles can gain both the endurance and the strength needed for everyday activities through controlled exercises. Complete these exercises as instructed by your physician, physical therapist, or athletic trainer. Progress the resistance and repetitions only as guided. You may experience muscle soreness or fatigue, but the pain or discomfort you are trying to eliminate should never worsen during these exercises. If this pain does worsen, stop and make certain you are following the directions exactly. If the pain is still present after adjustments, discontinue the exercise until you can discuss the trouble with your clinician.  STRENGTH - Cervical Flexors, Isometric Face a wall, standing about 6 inches away. Place a small pillow, a ball about 6-8 inches in diameter, or a folded towel between your forehead and the wall. Slightly tuck your chin and gently push your forehead into the soft object. Push only with mild to moderate intensity, building up tension gradually. Keep your jaw and forehead relaxed. Hold 10 to 20 seconds. Keep your breathing relaxed. Release the tension slowly. Relax your neck muscles completely before you start the next repetition. Repeat 2-3 times. Complete this exercise 1 time per day.  STRENGTH- Cervical Lateral Flexors, Isometric  Stand about 6 inches away from a wall. Place a small pillow, a ball about 6-8 inches in diameter, or a folded towel between the side of your head and the wall. Slightly tuck your chin and gently tilt your head into the soft object. Push only with mild to moderate intensity, building up tension gradually. Keep your jaw and forehead  relaxed. Hold 10 to 20 seconds. Keep your breathing relaxed. Release the tension slowly. Relax your neck muscles completely before you start the next repetition. Repeat 2-3 times. Complete this exercise 1 time per day.  STRENGTH - Cervical Extensors, Isometric  Stand about 6 inches away from a wall. Place a small pillow, a ball about 6-8 inches in diameter, or a folded towel between the back of your head and the wall. Slightly tuck your chin and gently tilt your head back into the soft object. Push only with mild to moderate intensity, building up tension gradually. Keep your jaw and forehead relaxed. Hold 10 to 20 seconds. Keep your breathing relaxed. Release the tension slowly. Relax your neck muscles completely before you start the next repetition. Repeat 2-3 times. Complete this exercise 1 time per day.  POSTURE AND BODY MECHANICS CONSIDERATIONS Keeping correct posture when sitting, standing or completing your activities will reduce the stress put on different body tissues, allowing injured tissues a chance to heal and limiting painful experiences. The following are general guidelines for improved posture. Your physician or physical therapist will provide you with any instructions specific to your needs. While reading these guidelines, remember: The exercises prescribed by your provider will help you have  the flexibility and strength to maintain correct postures. The correct posture provides the optimal environment for your joints to work. All of your joints have less wear and tear when properly supported by a spine with good posture. This means you will experience a healthier, less painful body. Correct posture must be practiced with all of your activities, especially prolonged sitting and standing. Correct posture is as important when doing repetitive low-stress activities (typing) as it is when doing a single heavy-load activity (lifting).  PROLONGED STANDING WHILE SLIGHTLY LEANING  FORWARD When completing a task that requires you to lean forward while standing in one place for a long time, place either foot up on a stationary 2- to 4-inch high object to help maintain the best posture. When both feet are on the ground, the low back tends to lose its slight inward curve. If this curve flattens (or becomes too large), then the back and your other joints will experience too much stress, fatigue more quickly, and can cause pain.   RESTING POSITIONS Consider which positions are most painful for you when choosing a resting position. If you have pain with flexion-based activities (sitting, bending, stooping, squatting), choose a position that allows you to rest in a less flexed posture. You would want to avoid curling into a fetal position on your side. If your pain worsens with extension-based activities (prolonged standing, working overhead), avoid resting in an extended position such as sleeping on your stomach. Most people will find more comfort when they rest with their spine in a more neutral position, neither too rounded nor too arched. Lying on a non-sagging bed on your side with a pillow between your knees, or on your back with a pillow under your knees will often provide some relief. Keep in mind, being in any one position for a prolonged period of time, no matter how correct your posture, can still lead to stiffness.  WALKING Walk with an upright posture. Your ears, shoulders, and hips should all line up. OFFICE WORK When working at a desk, create an environment that supports good, upright posture. Without extra support, muscles fatigue and lead to excessive strain on joints and other tissues.  CHAIR: A chair should be able to slide under your desk when your back makes contact with the back of the chair. This allows you to work closely. The chair's height should allow your eyes to be level with the upper part of your monitor and your hands to be slightly lower than your  elbows. Body position: Your feet should make contact with the floor. If this is not possible, use a foot rest. Keep your ears over your shoulders. This will reduce stress on your neck and low back.

## 2022-07-15 ENCOUNTER — Other Ambulatory Visit: Payer: Self-pay | Admitting: Family Medicine

## 2022-07-15 ENCOUNTER — Encounter: Payer: Self-pay | Admitting: Family Medicine

## 2022-07-15 MED ORDER — MELOXICAM 15 MG PO TABS: 15.0000 mg | ORAL_TABLET | Freq: Every day | ORAL | 0 refills | Status: DC

## 2022-07-17 ENCOUNTER — Other Ambulatory Visit (INDEPENDENT_AMBULATORY_CARE_PROVIDER_SITE_OTHER): Payer: 59

## 2022-07-17 DIAGNOSIS — D582 Other hemoglobinopathies: Secondary | ICD-10-CM | POA: Diagnosis not present

## 2022-07-17 LAB — CBC
HCT: 50.7 % (ref 39.0–52.0)
Hemoglobin: 17 g/dL (ref 13.0–17.0)
MCHC: 33.5 g/dL (ref 30.0–36.0)
MCV: 88.4 fl (ref 78.0–100.0)
Platelets: 117 10*3/uL — ABNORMAL LOW (ref 150.0–400.0)
RBC: 5.74 Mil/uL (ref 4.22–5.81)
RDW: 14.1 % (ref 11.5–15.5)
WBC: 10.9 10*3/uL — ABNORMAL HIGH (ref 4.0–10.5)

## 2022-07-20 ENCOUNTER — Encounter: Payer: Self-pay | Admitting: Family Medicine

## 2022-07-20 LAB — ERYTHROPOIETIN: Erythropoietin: 13.6 m[IU]/mL (ref 2.6–18.5)

## 2022-07-24 ENCOUNTER — Ambulatory Visit: Payer: 59 | Attending: Family Medicine | Admitting: Rehabilitative and Restorative Service Providers"

## 2022-07-24 ENCOUNTER — Encounter: Payer: Self-pay | Admitting: Rehabilitative and Restorative Service Providers"

## 2022-07-24 ENCOUNTER — Other Ambulatory Visit: Payer: Self-pay

## 2022-07-24 DIAGNOSIS — R293 Abnormal posture: Secondary | ICD-10-CM | POA: Diagnosis present

## 2022-07-24 DIAGNOSIS — M5412 Radiculopathy, cervical region: Secondary | ICD-10-CM | POA: Diagnosis present

## 2022-07-24 NOTE — Therapy (Signed)
OUTPATIENT PHYSICAL THERAPY CERVICAL EVALUATION   Patient Name: Travis Fields MRN: [id number] DOB:1965-05-10, 57 y.o., male Today's Date: 07/24/2022  END OF SESSION:  PT End of Session - 07/24/22 1151     Visit Number 1    Number of Visits 12    Date for PT Re-Evaluation 09/04/22    Authorization Type [employer]    PT Start Time 1015    PT Stop Time 1055    PT Time Calculation (min) 40 min    Activity Tolerance Patient tolerated treatment well    Behavior During Therapy WFL for tasks assessed/performed             Past Medical History:  Diagnosis Date   Allergy    Asthma    Back pain    Bilateral swelling of feet    Chronic kidney disease    kidney stones    Essential hypertension 03/29/2017   GERD (gastroesophageal reflux disease)    dx'd with GERD 10- 12 yrs ago with EGD    Gout    Heartburn    High cholesterol    History of chicken pox    History of kidney stones    History of shingles    Hypertension    Joint pain    Morbid obesity (HCC) 12/05/2015   OSA on CPAP 12/05/2015   Right foot pain 06/07/2017   Seasonal allergies    Sleep apnea    wears cpap    SOB (shortness of breath)    Swallowing difficulty    Type 2 diabetes mellitus with hyperglycemia, without long-term current use of insulin (HCC) 05/29/2020   Past Surgical History:  Procedure Laterality Date   INNER EAR SURGERY     UPPER GASTROINTESTINAL ENDOSCOPY     Patient Active Problem List   Diagnosis Date Noted   Gout of multiple sites 07/10/2022   Nonspecific abnormal electrocardiogram (ECG) (EKG) 06/11/2020   Swallowing difficulty    SOB (shortness of breath)    Sleep apnea    Seasonal allergies    Joint pain    Hypertension    History of kidney stones    History of chicken pox    High cholesterol    Heartburn    Chronic kidney disease    Bilateral swelling of feet    Back pain    Allergy    Type 2 diabetes mellitus with hyperglycemia, without long-term current use of insulin (HCC)  05/29/2020   Right foot pain 06/07/2017   Essential hypertension 03/29/2017   Morbid obesity (HCC) 12/05/2015   GERD (gastroesophageal reflux disease) 12/05/2015   OSA on CPAP 12/05/2015    PCP: Arva Chafe  REFERRING PROVIDER: Arva Chafe  REFERRING DIAG: cervical radiculopathy  THERAPY DIAG:  Radiculopathy, cervical region  Abnormal posture  Rationale for Evaluation and Treatment: Rehabilitation  ONSET DATE: 07/10/22  SUBJECTIVE:  SUBJECTIVE STATEMENT: Pt states that about 2 weeks ago he had shooting pain from his Lt shoulder to Lt hand. The pain included his armpit area and chest. Pt went to urgent care and EKG was clear. Pt states pain has decreased but still has pain in Lt elbow and tricep, Lt index finger numbness, occasional pain in Lt side and underarm area. Pt has increased pain with prolonged sitting and driving, eases with movement and un weighting. Pt also reports some neck tension with bending and twisting.   PERTINENT HISTORY:  Lt wrist fracture about 10 years ago  PAIN:  Are you having pain? Yes: NPRS scale: 3/10, 6/10 at worst/10 Pain location: Lt shoulder to hand Pain description: tingling, throbbing Aggravating factors: end of day, prolonged sitting and driving Relieving factors: un weighting, movement  PRECAUTIONS: None  WEIGHT BEARING RESTRICTIONS: No  FALLS:  Has patient fallen in last 6 months? 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  PLOF: Independent  PATIENT GOALS: decrease pain   OBJECTIVE:   PATIENT SURVEYS:  FOTO 67    SENSATION: Pt reports numbness Lt index finger  POSTURE: rounded shoulders and forward head  PALPATION: Increased mm spasticity  Lt upper trap, Lt pecs Hypomobility C4-C6 with lateral mobs   CERVICAL ROM:   Active ROM A/PROM (deg) eval  Flexion 55  Extension 50  Right lateral flexion 48  Left lateral flexion 25 pain  Right rotation 60  Left rotation 50   (Blank rows = not tested)  UPPER EXTREMITY MMT:  MMT Right eval Left eval  Shoulder flexion 4+ 4+  Shoulder extension    Shoulder abduction 4+ 4+  Shoulder adduction    Shoulder extension    Shoulder internal rotation    Shoulder external rotation    Middle trapezius    Lower trapezius    Elbow flexion    Elbow extension    Wrist flexion    Wrist extension    Wrist ulnar deviation    Wrist radial deviation    Wrist pronation    Wrist supination    Grip strength 100# 95#   (Blank rows = not tested)  CERVICAL SPECIAL TESTS:  Spurling's test: Positive  Median nerve test  (+) Bakody's sign (+)  TODAY'S TREATMENT:                                                                                                                              DATE: 07/24/22 See HEP   PATIENT EDUCATION:  Education details: PT POC and goals, HEP Person educated: Patient Education method: Explanation, Demonstration, and Handouts Education comprehension: verbalized understanding and returned demonstration  HOME EXERCISE PROGRAM: Access Code: [email or web address] URL: [email or web address] Date: 07/24/2022 Prepared by: Reggy Eye  Exercises - Doorway Pec Stretch at 90 Degrees Abduction  - 1 x daily - 7 x weekly - 1 sets - 3 reps -  20-30 seconds hold - Doorway Pec Stretch at 120 Degrees Abduction  - 1 x daily - 7 x weekly - 1 sets - 3 reps - 20-30 seconds hold - Seated Cervical Sidebending with Strap and Overpressure  - 1 x daily - 7 x weekly - 1 sets - 10 reps - 10 seconds hold - Median Nerve Flossing - Tray  - 1 x daily - 7 x weekly - 2 sets - 10 reps  ASSESSMENT:  CLINICAL IMPRESSION: Patient is a 57 y.o. male who was seen today for physical  therapy evaluation and treatment for cervical radiculopathy. Pt presents with decreased ROM, impaired posture, impaired sensation, increased pain and decreased activity tolerance. Pt will benefit from skilled PT to address deficits and improve functional activity tolerance with decreased pain.   OBJECTIVE IMPAIRMENTS: decreased activity tolerance, decreased ROM, decreased strength, increased muscle spasms, impaired sensation, postural dysfunction, and pain.   ACTIVITY LIMITATIONS: sitting  PARTICIPATION LIMITATIONS: driving and community activity  PERSONAL FACTORS: Profession are also affecting patient's functional outcome.   REHAB POTENTIAL: Good  CLINICAL DECISION MAKING: Evolving/moderate complexity  EVALUATION COMPLEXITY: Moderate   GOALS: Goals reviewed with patient? Yes  SHORT TERM GOALS: Target date: 08/07/2022    Pt will be independent with initial HEP Baseline:  Goal status: INITIAL   LONG TERM GOALS: Target date: 09/04/2022    Pt will be independent with advanced HEP Baseline:  Goal status: INITIAL  2.  Pt will demo improved functional mobility by improved FOTO score of >= 80 Baseline:  Goal status: INITIAL  3.  Pt will improve Lt cervical side bending ROM by 15 degrees Baseline:  Goal status: INITIAL  4.  Pt will tolerate sitting x 1 hour with symptoms <= 2/10 Baseline:  Goal status: INITIAL  5.  Pt will report pain at the end of the day <= 2/10 Baseline:  Goal status: INITIAL    PLAN:  PT FREQUENCY: 1-2x/week  PT DURATION: 6 weeks  PLANNED INTERVENTIONS: Therapeutic exercises, Therapeutic activity, Neuromuscular re-education, Balance training, Gait training, Patient/Family education, Self Care, Joint mobilization, Aquatic Therapy, Dry Needling, Electrical stimulation, Cryotherapy, Moist heat, Taping, Traction, Ultrasound, Manual therapy, and Re-evaluation  PLAN FOR NEXT SESSION: assess response to HEP, cervical jt mobs and manual work, Postural  strength   DONAWERTH,KAREN, PT 07/24/2022, 11:51 AM

## 2022-07-29 ENCOUNTER — Ambulatory Visit: Payer: 59 | Admitting: Rehabilitative and Restorative Service Providers"

## 2022-07-29 ENCOUNTER — Encounter: Payer: Self-pay | Admitting: Rehabilitative and Restorative Service Providers"

## 2022-07-29 DIAGNOSIS — M5412 Radiculopathy, cervical region: Secondary | ICD-10-CM | POA: Diagnosis not present

## 2022-07-29 DIAGNOSIS — R293 Abnormal posture: Secondary | ICD-10-CM

## 2022-07-29 NOTE — Therapy (Signed)
OUTPATIENT PHYSICAL THERAPY CERVICAL TREATMENT   Patient Name: Travis Fields MRN: [id number] DOB:Jun 15, 1965, 57 y.o., male Today's Date: 07/29/2022  END OF SESSION:  PT End of Session - 07/29/22 1028     Visit Number 2    Number of Visits 12    Date for PT Re-Evaluation 09/04/22    Authorization Type [employer]    PT Start Time 1024    PT Stop Time 1104    PT Time Calculation (min) 40 min    Activity Tolerance Patient tolerated treatment well    Behavior During Therapy WFL for tasks assessed/performed              Past Medical History:  Diagnosis Date   Allergy    Asthma    Back pain    Bilateral swelling of feet    Chronic kidney disease    kidney stones    Essential hypertension 03/29/2017   GERD (gastroesophageal reflux disease)    dx'd with GERD 10- 12 yrs ago with EGD    Gout    Heartburn    High cholesterol    History of chicken pox    History of kidney stones    History of shingles    Hypertension    Joint pain    Morbid obesity (HCC) 12/05/2015   OSA on CPAP 12/05/2015   Right foot pain 06/07/2017   Seasonal allergies    Sleep apnea    wears cpap    SOB (shortness of breath)    Swallowing difficulty    Type 2 diabetes mellitus with hyperglycemia, without long-term current use of insulin (HCC) 05/29/2020   Past Surgical History:  Procedure Laterality Date   INNER EAR SURGERY     UPPER GASTROINTESTINAL ENDOSCOPY     Patient Active Problem List   Diagnosis Date Noted   Gout of multiple sites 07/10/2022   Nonspecific abnormal electrocardiogram (ECG) (EKG) 06/11/2020   Swallowing difficulty    SOB (shortness of breath)    Sleep apnea    Seasonal allergies    Joint pain    Hypertension    History of kidney stones    History of chicken pox    High cholesterol    Heartburn    Chronic kidney disease    Bilateral swelling of feet    Back pain    Allergy    Type 2 diabetes mellitus with hyperglycemia, without long-term current use of insulin (HCC)  05/29/2020   Right foot pain 06/07/2017   Essential hypertension 03/29/2017   Morbid obesity (HCC) 12/05/2015   GERD (gastroesophageal reflux disease) 12/05/2015   OSA on CPAP 12/05/2015    PCP: Arva Chafe REFERRING PROVIDER: Arva Chafe REFERRING DIAG: cervical radiculopathy THERAPY DIAG:  Radiculopathy, cervical region  Abnormal posture  Rationale for Evaluation and Treatment: Rehabilitation  ONSET DATE: 07/10/22  SUBJECTIVE:  SUBJECTIVE STATEMENT: Pt states that about 2 weeks ago he had shooting pain from his Lt shoulder to Lt hand. The pain included his armpit area and chest. Pt went to urgent care and EKG was clear. Pt states pain has decreased but still has pain in Lt elbow and tricep, Lt index finger numbness, occasional pain in Lt side and underarm area. Pt has increased pain with prolonged sitting and driving, eases with movement and un weighting. Pt also reports some neck tension with bending and twisting. PERTINENT HISTORY:  Lt wrist fracture about 10 years ago PAIN:  Are you having pain? Yes: NPRS scale: 4/10 Pain location: Lt shoulder to hand Pain description: tingling, throbbing Aggravating factors: end of day, prolonged sitting and driving Relieving factors: un weighting, movement PRECAUTIONS: None WEIGHT BEARING RESTRICTIONS: No FALLS:  Has patient fallen in last 6 months? No PATIENT GOALS: decrease pain  OBJECTIVE:  (Measures in this section from initial evaluation unless otherwise noted) PATIENT SURVEYS:  FOTO 67 SENSATION: Pt reports numbness Lt index finger POSTURE: rounded shoulders and forward head PALPATION: Increased mm spasticity Lt upper trap, Lt pecs Hypomobility C4-C6 with lateral mobs  CERVICAL ROM:  Active ROM A/PROM (deg) eval   Flexion 55  Extension 50  Right lateral flexion 48  Left lateral flexion 25 pain  Right rotation 60  Left rotation 50   (Blank rows = not tested) UPPER EXTREMITY MMT: MMT Right eval Left eval  Shoulder flexion 4+ 4+  Shoulder extension    Shoulder abduction 4+ 4+  Shoulder adduction    Shoulder extension    Shoulder internal rotation    Shoulder external rotation    Middle trapezius    Lower trapezius    Elbow flexion    Elbow extension    Wrist flexion    Wrist extension    Wrist ulnar deviation    Wrist radial deviation    Wrist pronation    Wrist supination    Grip strength 100# 95#   (Blank rows = not tested) CERVICAL SPECIAL TESTS:  Spurling's test: Positive MEdian nerve test  (+) Bakody's sign (+)  [facility] Adult PT Treatment:                                                DATE: 07/29/22 Therapeutic Exercise: Prone Shoulder extension x 5 reps Shoulder flexion "superman" alternating 10 reps R and L sides On elbows, with protraction/retraction x 10 reps with cues on head position On elbows with neck retraction and rotaiton R and L sides  On elbows with opening up R and L for thoracic rotation Sitting Shoulder circles posteriorly Standing Horizontal abduction x 12 reps (feels anterior stretch, scap work)  R and L sides Wall lean on elbows with thread the needle + opening up for thoracic rotation x 10 reps Chin tucks x 10 reps Manual Therapy: STM L upper trap, L cervical paraspinals, suboccipitals, lateral scapular musculature Sidelying right with cervical STM on L side and scapular mobilization Prone STM lower c-spine  Manual traction with no change in symptoms  TODAY'S TREATMENT:  DATE: 07/24/22 See HEP   PATIENT EDUCATION:  Education details: PT POC and goals, HEP Person educated: Patient Education method: Explanation,  Demonstration, and Handouts Education comprehension: verbalized understanding and returned demonstration  HOME EXERCISE PROGRAM: Access Code: [email or web address] URL: [email or web address] Date: 07/24/2022 Prepared by: Reggy Eye  Exercises - Doorway Pec Stretch at 90 Degrees Abduction  - 1 x daily - 7 x weekly - 1 sets - 3 reps - 20-30 seconds hold - Doorway Pec Stretch at 120 Degrees Abduction  - 1 x daily - 7 x weekly - 1 sets - 3 reps - 20-30 seconds hold - Seated Cervical Sidebending with Strap and Overpressure  - 1 x daily - 7 x weekly - 1 sets - 10 reps - 10 seconds hold - Median Nerve Flossing - Tray  - 1 x daily - 7 x weekly - 2 sets - 10 reps  ASSESSMENT:  CLINICAL IMPRESSION: The patient notes some irritation of symptoms after HEP performance. Therefore, we modified HEP. Plan to add cervical traction and consider DN if we cannot centralize symptoms with exercise.  Eval: Patient is a 57 y.o. male who was seen today for physical therapy evaluation and treatment for cervical radiculopathy. Pt presents with decreased ROM, impaired posture, impaired sensation, increased pain and decreased activity tolerance. Pt will benefit from skilled PT to address deficits and improve functional activity tolerance with decreased pain.   OBJECTIVE IMPAIRMENTS: decreased activity tolerance, decreased ROM, decreased strength, increased muscle spasms, impaired sensation, postural dysfunction, and pain.   GOALS: Goals reviewed with patient? Yes  SHORT TERM GOALS: Target date: 08/07/2022  Pt will be independent with initial HEP Baseline:  Goal status: INITIAL   LONG TERM GOALS: Target date: 09/04/2022  Pt will be independent with advanced HEP Baseline:  Goal status: INITIAL  2.  Pt will demo improved functional mobility by improved FOTO score of >= 80 Baseline:  Goal status: INITIAL  3.  Pt will improve Lt cervical side bending ROM by 15 degrees Baseline:  Goal status:  INITIAL  4.  Pt will tolerate sitting x 1 hour with symptoms <= 2/10 Baseline:  Goal status: INITIAL  5.  Pt will report pain at the end of the day <= 2/10 Baseline:  Goal status: INITIAL  PLAN:  PT FREQUENCY: 1-2x/week  PT DURATION: 6 weeks  PLANNED INTERVENTIONS: Therapeutic exercises, Therapeutic activity, Neuromuscular re-education, Balance training, Gait training, Patient/Family education, Self Care, Joint mobilization, Aquatic Therapy, Dry Needling, Electrical stimulation, Cryotherapy, Moist heat, Taping, Traction, Ultrasound, Manual therapy, and Re-evaluation  PLAN FOR NEXT SESSION: assess response to HEP, cervical jt mobs and manual work, Postural strength, cervical traction   Shylah Dossantos, PT 07/29/2022, 1:59 PM

## 2022-07-31 ENCOUNTER — Ambulatory Visit: Payer: 59 | Admitting: Physical Therapy

## 2022-07-31 ENCOUNTER — Other Ambulatory Visit: Payer: Self-pay | Admitting: Family Medicine

## 2022-07-31 ENCOUNTER — Encounter: Payer: Self-pay | Admitting: Physical Therapy

## 2022-07-31 DIAGNOSIS — M109 Gout, unspecified: Secondary | ICD-10-CM

## 2022-07-31 DIAGNOSIS — M5412 Radiculopathy, cervical region: Secondary | ICD-10-CM | POA: Diagnosis not present

## 2022-07-31 DIAGNOSIS — R293 Abnormal posture: Secondary | ICD-10-CM

## 2022-07-31 MED ORDER — ALLOPURINOL 100 MG PO TABS
ORAL_TABLET | ORAL | 6 refills | Status: DC
Start: 2022-07-31 — End: 2023-03-02

## 2022-07-31 NOTE — Therapy (Signed)
OUTPATIENT PHYSICAL THERAPY CERVICAL TREATMENT   Patient Name: Arian Butters MRN: [id number] DOB:09-13-1965, 57 y.o., male Today's Date: 07/31/2022  END OF SESSION:  PT End of Session - 07/31/22 0915     Visit Number 3    Number of Visits 12    Date for PT Re-Evaluation 09/04/22    Authorization Type [employer]    PT Start Time 0845    PT Stop Time 0925    PT Time Calculation (min) 40 min    Activity Tolerance Patient tolerated treatment well    Behavior During Therapy [facility] for tasks assessed/performed               Past Medical History:  Diagnosis Date   Allergy    Asthma    Back pain    Bilateral swelling of feet    Chronic kidney disease    kidney stones    Essential hypertension 03/29/2017   GERD (gastroesophageal reflux disease)    dx'd with GERD 10- 12 yrs ago with EGD    Gout    Heartburn    High cholesterol    History of chicken pox    History of kidney stones    History of shingles    Hypertension    Joint pain    Morbid obesity (HCC) 12/05/2015   OSA on CPAP 12/05/2015   Right foot pain 06/07/2017   Seasonal allergies    Sleep apnea    wears cpap    SOB (shortness of breath)    Swallowing difficulty    Type 2 diabetes mellitus with hyperglycemia, without long-term current use of insulin (HCC) 05/29/2020   Past Surgical History:  Procedure Laterality Date   INNER EAR SURGERY     UPPER GASTROINTESTINAL ENDOSCOPY     Patient Active Problem List   Diagnosis Date Noted   Gout of multiple sites 07/10/2022   Nonspecific abnormal electrocardiogram (ECG) (EKG) 06/11/2020   Swallowing difficulty    SOB (shortness of breath)    Sleep apnea    Seasonal allergies    Joint pain    Hypertension    History of kidney stones    History of chicken pox    High cholesterol    Heartburn    Chronic kidney disease    Bilateral swelling of feet    Back pain    Allergy    Type 2 diabetes mellitus with hyperglycemia, without long-term current use of insulin (HCC)  05/29/2020   Right foot pain 06/07/2017   Essential hypertension 03/29/2017   Morbid obesity (HCC) 12/05/2015   GERD (gastroesophageal reflux disease) 12/05/2015   OSA on CPAP 12/05/2015    PCP: Arva Chafe REFERRING PROVIDER: Arva Chafe REFERRING DIAG: cervical radiculopathy THERAPY DIAG:  Radiculopathy, cervical region  Abnormal posture  Rationale for Evaluation and Treatment: Rehabilitation  ONSET DATE: 07/10/22  SUBJECTIVE:  SUBJECTIVE STATEMENT: Pt states no change in symptoms since last visit, he still has tingling and states his symptoms are irritated after therapy but calm down after a day PERTINENT HISTORY:  Lt wrist fracture about 10 years ago PAIN:  Are you having pain? Yes: NPRS scale: 4/10 Pain location: Lt shoulder to hand Pain description: tingling, throbbing Aggravating factors: end of day, prolonged sitting and driving Relieving factors: un weighting, movement PRECAUTIONS: None WEIGHT BEARING RESTRICTIONS: No FALLS:  Has patient fallen in last 6 months? No PATIENT GOALS: decrease pain  OBJECTIVE:  (Measures in this section from initial evaluation unless otherwise noted) PATIENT SURVEYS:  FOTO 67 SENSATION: Pt reports numbness Lt index finger POSTURE: rounded shoulders and forward head PALPATION: Increased mm spasticity Lt upper trap, Lt pecs Hypomobility C4-C6 with lateral mobs  CERVICAL ROM:  Active ROM A/PROM (deg) eval  Flexion 55  Extension 50  Right lateral flexion 48  Left lateral flexion 25 pain  Right rotation 60  Left rotation 50   (Blank rows = not tested) UPPER EXTREMITY MMT: MMT Right eval Left eval  Shoulder flexion 4+ 4+  Shoulder extension    Shoulder abduction 4+ 4+  Shoulder adduction    Shoulder extension     Shoulder internal rotation    Shoulder external rotation    Middle trapezius    Lower trapezius    Elbow flexion    Elbow extension    Wrist flexion    Wrist extension    Wrist ulnar deviation    Wrist radial deviation    Wrist pronation    Wrist supination    Grip strength 100# 95#   (Blank rows = not tested) CERVICAL SPECIAL TESTS:  Spurling's test: Positive MEdian nerve test  (+) Bakody's sign (+)  [facility] Adult PT Treatment:                                                DATE: 07/31/22 Therapeutic Exercise: UBE L2 x 4 min alt fwd/bkwd Horizontal abd with red TB x 20 Shoulder ext green TB x 20 Lower trap setting at wall x 10 Wall lean with thread the needle x 10 bilat Manual Therapy: STM bilat cervical paraspinals, upper traps Mobilization with movement for Lt cervical sidebending and rotation Modalities: Cervical traction min 5, max 20 x 12 minutes   [facility] Adult PT Treatment:                                                DATE: 07/29/22 Therapeutic Exercise: Prone Shoulder extension x 5 reps Shoulder flexion "superman" alternating 10 reps R and L sides On elbows, with protraction/retraction x 10 reps with cues on head position On elbows with neck retraction and rotaiton R and L sides  On elbows with opening up R and L for thoracic rotation Sitting Shoulder circles posteriorly Standing Horizontal abduction x 12 reps (feels anterior stretch, scap work)  R and L sides Wall lean on elbows with thread the needle + opening up for thoracic rotation x 10 reps Chin tucks x 10 reps Manual Therapy: STM L upper trap, L cervical paraspinals, suboccipitals, lateral scapular musculature Sidelying right with cervical STM on L side and scapular mobilization Prone STM lower  c-spine  Manual traction with no change in symptoms  TODAY'S TREATMENT:                                                                                                                              DATE:  07/24/22 See HEP   PATIENT EDUCATION:  Education details: PT POC and goals, HEP Person educated: Patient Education method: Explanation, Demonstration, and Handouts Education comprehension: verbalized understanding and returned demonstration  HOME EXERCISE PROGRAM: Access Code: [email or web address] URL: [email or web address] Date: 07/24/2022 Prepared by: Reggy Eye  Exercises - Doorway Pec Stretch at 90 Degrees Abduction  - 1 x daily - 7 x weekly - 1 sets - 3 reps - 20-30 seconds hold - Doorway Pec Stretch at 120 Degrees Abduction  - 1 x daily - 7 x weekly - 1 sets - 3 reps - 20-30 seconds hold - Seated Cervical Sidebending with Strap and Overpressure  - 1 x daily - 7 x weekly - 1 sets - 10 reps - 10 seconds hold - Median Nerve Flossing - Tray  - 1 x daily - 7 x weekly - 2 sets - 10 reps  ASSESSMENT:  CLINICAL IMPRESSION: Pt with good tolerance to exercises throughout the session. Improved mobility after manual work. Trial of mechanical cervical traction with no discomfort during session.    OBJECTIVE IMPAIRMENTS: decreased activity tolerance, decreased ROM, decreased strength, increased muscle spasms, impaired sensation, postural dysfunction, and pain.   GOALS: Goals reviewed with patient? Yes  SHORT TERM GOALS: Target date: 08/07/2022  Pt will be independent with initial HEP Baseline:  Goal status: INITIAL   LONG TERM GOALS: Target date: 09/04/2022  Pt will be independent with advanced HEP Baseline:  Goal status: INITIAL  2.  Pt will demo improved functional mobility by improved FOTO score of >= 80 Baseline:  Goal status: INITIAL  3.  Pt will improve Lt cervical side bending ROM by 15 degrees Baseline:  Goal status: INITIAL  4.  Pt will tolerate sitting x 1 hour with symptoms <= 2/10 Baseline:  Goal status: INITIAL  5.  Pt will report pain at the end of the day <= 2/10 Baseline:  Goal status: INITIAL  PLAN:  PT FREQUENCY: 1-2x/week  PT DURATION:  6 weeks  PLANNED INTERVENTIONS: Therapeutic exercises, Therapeutic activity, Neuromuscular re-education, Balance training, Gait training, Patient/Family education, Self Care, Joint mobilization, Aquatic Therapy, Dry Needling, Electrical stimulation, Cryotherapy, Moist heat, Taping, Traction, Ultrasound, Manual therapy, and Re-evaluation  PLAN FOR NEXT SESSION: how was traction? assess response to HEP, cervical jt mobs and manual work, Postural strength  Mahala Rommel, PT 07/31/2022, 9:16 AM

## 2022-08-04 ENCOUNTER — Ambulatory Visit: Payer: 59 | Admitting: Physical Therapy

## 2022-08-04 ENCOUNTER — Encounter: Payer: Self-pay | Admitting: Physical Therapy

## 2022-08-04 DIAGNOSIS — R293 Abnormal posture: Secondary | ICD-10-CM

## 2022-08-04 DIAGNOSIS — M5412 Radiculopathy, cervical region: Secondary | ICD-10-CM | POA: Diagnosis not present

## 2022-08-04 NOTE — Therapy (Signed)
OUTPATIENT PHYSICAL THERAPY CERVICAL TREATMENT   Patient Name: Travis Fields MRN: [id number] DOB:1965/05/02, 57 y.o., male Today's Date: 08/04/2022  END OF SESSION:  PT End of Session - 08/04/22 1435     Visit Number 4    Number of Visits 12    Date for PT Re-Evaluation 09/04/22    PT Start Time 1355    PT Stop Time 1440    PT Time Calculation (min) 45 min    Activity Tolerance Patient tolerated treatment well    Behavior During Therapy [facility] for tasks assessed/performed                Past Medical History:  Diagnosis Date   Allergy    Asthma    Back pain    Bilateral swelling of feet    Chronic kidney disease    kidney stones    Essential hypertension 03/29/2017   GERD (gastroesophageal reflux disease)    dx'd with GERD 10- 12 yrs ago with EGD    Gout    Heartburn    High cholesterol    History of chicken pox    History of kidney stones    History of shingles    Hypertension    Joint pain    Morbid obesity (HCC) 12/05/2015   OSA on CPAP 12/05/2015   Right foot pain 06/07/2017   Seasonal allergies    Sleep apnea    wears cpap    SOB (shortness of breath)    Swallowing difficulty    Type 2 diabetes mellitus with hyperglycemia, without long-term current use of insulin (HCC) 05/29/2020   Past Surgical History:  Procedure Laterality Date   INNER EAR SURGERY     UPPER GASTROINTESTINAL ENDOSCOPY     Patient Active Problem List   Diagnosis Date Noted   Gout of multiple sites 07/10/2022   Nonspecific abnormal electrocardiogram (ECG) (EKG) 06/11/2020   Swallowing difficulty    SOB (shortness of breath)    Sleep apnea    Seasonal allergies    Joint pain    Hypertension    History of kidney stones    History of chicken pox    High cholesterol    Heartburn    Chronic kidney disease    Bilateral swelling of feet    Back pain    Allergy    Type 2 diabetes mellitus with hyperglycemia, without long-term current use of insulin (HCC) 05/29/2020   Right foot  pain 06/07/2017   Essential hypertension 03/29/2017   Morbid obesity (HCC) 12/05/2015   GERD (gastroesophageal reflux disease) 12/05/2015   OSA on CPAP 12/05/2015    PCP: Arva Chafe REFERRING PROVIDER: Arva Chafe REFERRING DIAG: cervical radiculopathy THERAPY DIAG:  Radiculopathy, cervical region  Abnormal posture  Rationale for Evaluation and Treatment: Rehabilitation  ONSET DATE: 07/10/22  SUBJECTIVE:  SUBJECTIVE STATEMENT: Pt states he continues to have numbness in Lt index finger and continues with numbness/tingling in Lt UE that comes and goes. He does state that his neck feels "more loose" than it did PERTINENT HISTORY:  Lt wrist fracture about 10 years ago PAIN:  Are you having pain? Yes: NPRS scale: 4/10 Pain location: Lt shoulder to hand Pain description: tingling, throbbing Aggravating factors: end of day, prolonged sitting and driving Relieving factors: un weighting, movement PRECAUTIONS: None WEIGHT BEARING RESTRICTIONS: No FALLS:  Has patient fallen in last 6 months? No PATIENT GOALS: decrease pain  OBJECTIVE:  (Measures in this section from initial evaluation unless otherwise noted) PATIENT SURVEYS:  FOTO 67 SENSATION: Pt reports numbness Lt index finger POSTURE: rounded shoulders and forward head PALPATION: Increased mm spasticity Lt upper trap, Lt pecs Hypomobility C4-C6 with lateral mobs  CERVICAL ROM:  Active ROM A/PROM (deg) eval 08/04/22  Flexion 55   Extension 50   Right lateral flexion 48 50  Left lateral flexion 25 pain 35  Right rotation 60 60  Left rotation 50 60   (Blank rows = not tested) UPPER EXTREMITY MMT: MMT Right eval Left eval  Shoulder flexion 4+ 4+  Shoulder extension    Shoulder abduction 4+ 4+  Shoulder  adduction    Shoulder extension    Shoulder internal rotation    Shoulder external rotation    Middle trapezius    Lower trapezius    Elbow flexion    Elbow extension    Wrist flexion    Wrist extension    Wrist ulnar deviation    Wrist radial deviation    Wrist pronation    Wrist supination    Grip strength 100# 95#   (Blank rows = not tested) CERVICAL SPECIAL TESTS:  Spurling's test: Positive MEdian nerve test  (+) Bakody's sign (+)  [facility] Adult PT Treatment:                                                DATE: 08/04/22 Therapeutic Exercise: UBE L2 x 4 min alt fwd/bkwd Chin tuck x 10 Lower trap setting at wall x 10 Serratus wall slide x 10 red TB Wall clock red TB x 10 Wall lean with thread the needle x 10 bilat Horizontal abd with red TB x 20 Manual Therapy: STM bilat cervical paraspinals, upper traps Mobilization with movement for Lt cervical sidebending and rotation  Modalities: Cervical traction 5-25# x 10 minutes  [facility] Adult PT Treatment:                                                DATE: 07/31/22 Therapeutic Exercise: UBE L2 x 4 min alt fwd/bkwd Horizontal abd with red TB x 20 Shoulder ext green TB x 20 Lower trap setting at wall x 10 Wall lean with thread the needle x 10 bilat Manual Therapy: STM bilat cervical paraspinals, upper traps Mobilization with movement for Lt cervical sidebending and rotation Modalities: Cervical traction min 5, max 20 x 12 minutes   [facility] Adult PT Treatment:  DATE: 07/29/22 Therapeutic Exercise: Prone Shoulder extension x 5 reps Shoulder flexion "superman" alternating 10 reps R and L sides On elbows, with protraction/retraction x 10 reps with cues on head position On elbows with neck retraction and rotaiton R and L sides  On elbows with opening up R and L for thoracic rotation Sitting Shoulder circles posteriorly Standing Horizontal abduction x 12 reps (feels anterior stretch,  scap work)  R and L sides Wall lean on elbows with thread the needle + opening up for thoracic rotation x 10 reps Chin tucks x 10 reps Manual Therapy: STM L upper trap, L cervical paraspinals, suboccipitals, lateral scapular musculature Sidelying right with cervical STM on L side and scapular mobilization Prone STM lower c-spine  Manual traction with no change in symptoms    PATIENT EDUCATION:  Education details: PT POC and goals, HEP Person educated: Patient Education method: Explanation, Demonstration, and Handouts Education comprehension: verbalized understanding and returned demonstration  HOME EXERCISE PROGRAM: Access Code: [email or web address] URL: [email or web address] Date: 07/24/2022 Prepared by: Reggy Eye  Exercises - Doorway Pec Stretch at 90 Degrees Abduction  - 1 x daily - 7 x weekly - 1 sets - 3 reps - 20-30 seconds hold - Doorway Pec Stretch at 120 Degrees Abduction  - 1 x daily - 7 x weekly - 1 sets - 3 reps - 20-30 seconds hold - Seated Cervical Sidebending with Strap and Overpressure  - 1 x daily - 7 x weekly - 1 sets - 10 reps - 10 seconds hold - Median Nerve Flossing - Tray  - 1 x daily - 7 x weekly - 2 sets - 10 reps  ASSESSMENT:  CLINICAL IMPRESSION: Pt continues to respond well to manual work. He has increased cervical ROM (see measurements above). Continues with radicular symptoms with little relief   OBJECTIVE IMPAIRMENTS: decreased activity tolerance, decreased ROM, decreased strength, increased muscle spasms, impaired sensation, postural dysfunction, and pain.   GOALS: Goals reviewed with patient? Yes  SHORT TERM GOALS: Target date: 08/07/2022  Pt will be independent with initial HEP Baseline:  Goal status: MET   LONG TERM GOALS: Target date: 09/04/2022  Pt will be independent with advanced HEP Baseline:  Goal status: INITIAL  2.  Pt will demo improved functional mobility by improved FOTO score of >= 80 Baseline:  Goal status:  INITIAL  3.  Pt will improve Lt cervical side bending ROM by 15 degrees Baseline:  Goal status: INITIAL  4.  Pt will tolerate sitting x 1 hour with symptoms <= 2/10 Baseline:  Goal status: INITIAL  5.  Pt will report pain at the end of the day <= 2/10 Baseline:  Goal status: INITIAL  PLAN:  PT FREQUENCY: 1-2x/week  PT DURATION: 6 weeks  PLANNED INTERVENTIONS: Therapeutic exercises, Therapeutic activity, Neuromuscular re-education, Balance training, Gait training, Patient/Family education, Self Care, Joint mobilization, Aquatic Therapy, Dry Needling, Electrical stimulation, Cryotherapy, Moist heat, Taping, Traction, Ultrasound, Manual therapy, and Re-evaluation  PLAN FOR NEXT SESSION: assess response to HEP, cervical jt mobs and manual work, Postural strength  Avrie Kedzierski, PT 08/04/2022, 2:36 PM

## 2022-08-07 ENCOUNTER — Ambulatory Visit: Payer: 59 | Admitting: Physical Therapy

## 2022-08-09 ENCOUNTER — Other Ambulatory Visit: Payer: Self-pay | Admitting: Family Medicine

## 2022-08-11 ENCOUNTER — Ambulatory Visit: Payer: 59 | Admitting: Physical Therapy

## 2022-08-11 ENCOUNTER — Encounter: Payer: Self-pay | Admitting: Physical Therapy

## 2022-08-11 DIAGNOSIS — M5412 Radiculopathy, cervical region: Secondary | ICD-10-CM | POA: Diagnosis not present

## 2022-08-11 DIAGNOSIS — R293 Abnormal posture: Secondary | ICD-10-CM

## 2022-08-11 NOTE — Therapy (Addendum)
OUTPATIENT PHYSICAL THERAPY CERVICAL TREATMENT AND DISCHARGE   Patient Name: Travis Fields MRN: [id number] DOB:[birth year], 57 y.o., male Today's Date: 08/11/2022  END OF SESSION:  PT End of Session - 08/11/22 1058     Visit Number 5    Number of Visits 12    Date for PT Re-Evaluation 09/04/22    PT Start Time 1015    PT Stop Time 1058    PT Time Calculation (min) 43 min    Activity Tolerance Patient tolerated treatment well    Behavior During Therapy [facility] for tasks assessed/performed                 Past Medical History:  Diagnosis Date   Allergy    Asthma    Back pain    Bilateral swelling of feet    Chronic kidney disease    kidney stones    Essential hypertension 03/29/2017   GERD (gastroesophageal reflux disease)    dx'd with GERD 10- 12 yrs ago with EGD    Gout    Heartburn    High cholesterol    History of chicken pox    History of kidney stones    History of shingles    Hypertension    Joint pain    Morbid obesity (HCC) 12/05/2015   OSA on CPAP 12/05/2015   Right foot pain 06/07/2017   Seasonal allergies    Sleep apnea    wears cpap    SOB (shortness of breath)    Swallowing difficulty    Type 2 diabetes mellitus with hyperglycemia, without long-term current use of insulin (HCC) 05/29/2020   Past Surgical History:  Procedure Laterality Date   INNER EAR SURGERY     UPPER GASTROINTESTINAL ENDOSCOPY     Patient Active Problem List   Diagnosis Date Noted   Gout of multiple sites 07/10/2022   Nonspecific abnormal electrocardiogram (ECG) (EKG) 06/11/2020   Swallowing difficulty    SOB (shortness of breath)    Sleep apnea    Seasonal allergies    Joint pain    Hypertension    History of kidney stones    History of chicken pox    High cholesterol    Heartburn    Chronic kidney disease    Bilateral swelling of feet    Back pain    Allergy    Type 2 diabetes mellitus with hyperglycemia, without long-term current use of insulin (HCC) 05/29/2020    Right foot pain 06/07/2017   Essential hypertension 03/29/2017   Morbid obesity (HCC) 12/05/2015   GERD (gastroesophageal reflux disease) 12/05/2015   OSA on CPAP 12/05/2015    PCP: Arva Chafe REFERRING PROVIDER: Arva Chafe REFERRING DIAG: cervical radiculopathy THERAPY DIAG:  Radiculopathy, cervical region  Abnormal posture  Rationale for Evaluation and Treatment: Rehabilitation  ONSET DATE: 07/10/22  SUBJECTIVE:  SUBJECTIVE STATEMENT: Pt states his Lt wrist has been bothering him. He also states his underarm/rib area hurts when he coughs or sneezes. His finger is still numb. He states pain/symptoms are not as frequent but are still there PERTINENT HISTORY:  Lt wrist fracture about 10 years ago PAIN:  Are you having pain? Yes: NPRS scale: 4/10 Pain location: Lt shoulder to hand Pain description: tingling, throbbing Aggravating factors: end of day, prolonged sitting and driving Relieving factors: un weighting, movement PRECAUTIONS: None WEIGHT BEARING RESTRICTIONS: No FALLS:  Has patient fallen in last 6 months? No PATIENT GOALS: decrease pain  OBJECTIVE:  (Measures in this section from initial evaluation unless otherwise noted) PATIENT SURVEYS:  FOTO 67 SENSATION: Pt reports numbness Lt index finger POSTURE: rounded shoulders and forward head PALPATION: Increased mm spasticity Lt upper trap, Lt pecs Hypomobility C4-C6 with lateral mobs  CERVICAL ROM:  Active ROM A/PROM (deg) eval 08/04/22  Flexion 55   Extension 50   Right lateral flexion 48 50  Left lateral flexion 25 pain 35  Right rotation 60 60  Left rotation 50 60   (Blank rows = not tested) UPPER EXTREMITY MMT: MMT Right eval Left eval  Shoulder flexion 4+ 4+  Shoulder extension     Shoulder abduction 4+ 4+  Shoulder adduction    Shoulder extension    Shoulder internal rotation    Shoulder external rotation    Middle trapezius    Lower trapezius    Elbow flexion    Elbow extension    Wrist flexion    Wrist extension    Wrist ulnar deviation    Wrist radial deviation    Wrist pronation    Wrist supination    Grip strength 100# 95#   (Blank rows = not tested) CERVICAL SPECIAL TESTS:  Spurling's test: Positive MEdian nerve test  (+) Bakody's sign (+)  [facility] Adult PT Treatment:                                                DATE: 08/11/22 Therapeutic Exercise: UBE L3 x 4 min alt fwd/bkwd Supine serratus punch 3# 2 x 10 Serratus wall slide red TB 2 x 10 Prone row 3# 2 x 10 Prone tricep ext 3# 2 x 10 Prone "T" 3# x 20 Prone "Y" 1# x 20 Cervical retraction x 10 Manual Therapy: Lt shoulder mobs all directions grade 2-3 STM bilat cervical paraspinals, upper traps Mobilization with movement for Lt cervical sidebending and rotation 1st rib mobs grade 2-3   [facility] Adult PT Treatment:                                                DATE: 08/04/22 Therapeutic Exercise: UBE L2 x 4 min alt fwd/bkwd Chin tuck x 10 Lower trap setting at wall x 10 Serratus wall slide x 10 red TB Wall clock red TB x 10 Wall lean with thread the needle x 10 bilat Horizontal abd with red TB x 20 Manual Therapy: STM bilat cervical paraspinals, upper traps Mobilization with movement for Lt cervical sidebending and rotation  Modalities: Cervical traction 5-25# x 10 minutes  [facility] Adult PT Treatment:  DATE: 07/31/22 Therapeutic Exercise: UBE L2 x 4 min alt fwd/bkwd Horizontal abd with red TB x 20 Shoulder ext green TB x 20 Lower trap setting at wall x 10 Wall lean with thread the needle x 10 bilat Manual Therapy: STM bilat cervical paraspinals, upper traps Mobilization with movement for Lt cervical sidebending and  rotation Modalities: Cervical traction min 5, max 20 x 12 minutes   PATIENT EDUCATION:  Education details: PT POC and goals, HEP Person educated: Patient Education method: Explanation, Demonstration, and Handouts Education comprehension: verbalized understanding and returned demonstration  HOME EXERCISE PROGRAM: Access Code: [email or web address] URL: [email or web address] Date: 07/24/2022 Prepared by: Reggy Eye  Exercises - Doorway Pec Stretch at 90 Degrees Abduction  - 1 x daily - 7 x weekly - 1 sets - 3 reps - 20-30 seconds hold - Doorway Pec Stretch at 120 Degrees Abduction  - 1 x daily - 7 x weekly - 1 sets - 3 reps - 20-30 seconds hold - Seated Cervical Sidebending with Strap and Overpressure  - 1 x daily - 7 x weekly - 1 sets - 10 reps - 10 seconds hold - Median Nerve Flossing - Tray  - 1 x daily - 7 x weekly - 2 sets - 10 reps  ASSESSMENT:  CLINICAL IMPRESSION: Pt continues with numbness/tingling but not as often. Some good relief with 1st rib mobs today. Good tolerance to progression of postural strength. He returns to MD tomorrow for next steps   OBJECTIVE IMPAIRMENTS: decreased activity tolerance, decreased ROM, decreased strength, increased muscle spasms, impaired sensation, postural dysfunction, and pain.   GOALS: Goals reviewed with patient? Yes  SHORT TERM GOALS: Target date: 08/07/2022  Pt will be independent with initial HEP Baseline:  Goal status: MET   LONG TERM GOALS: Target date: 09/04/2022  Pt will be independent with advanced HEP Baseline:  Goal status: IN PROGRESS  2.  Pt will demo improved functional mobility by improved FOTO score of >= 80 Baseline:  Goal status: IN PROGRESS  3.  Pt will improve Lt cervical side bending ROM by 15 degrees Baseline:  Goal status: IN PROGRESS  4.  Pt will tolerate sitting x 1 hour with symptoms <= 2/10 Baseline:  Goal status: IN PROGRESS  5.  Pt will report pain at the end of the day <=  2/10 Baseline:  Goal status: IN PROGRESS  PLAN:  PT FREQUENCY: 1-2x/week  PT DURATION: 6 weeks  PLANNED INTERVENTIONS: Therapeutic exercises, Therapeutic activity, Neuromuscular re-education, Balance training, Gait training, Patient/Family education, Self Care, Joint mobilization, Aquatic Therapy, Dry Needling, Electrical stimulation, Cryotherapy, Moist heat, Taping, Traction, Ultrasound, Manual therapy, and Re-evaluation  PLAN FOR NEXT SESSION: assess response to HEP, cervical jt mobs and manual work, Postural strength  PHYSICAL THERAPY DISCHARGE SUMMARY  Visits from Start of Care: 5  Current functional level related to goals / functional outcomes: Decrease in frequency of symptoms   Remaining deficits: See above   Education / Equipment: HEP   Patient agrees to discharge. Patient goals were partially met. Patient is being discharged due to not returning since the last visit. Reggy Eye, PT,DPT07/31/249:11 AM    Kaliq Lege, PT 08/11/2022, 10:59 AM

## 2022-08-12 ENCOUNTER — Ambulatory Visit (INDEPENDENT_AMBULATORY_CARE_PROVIDER_SITE_OTHER): Payer: 59 | Admitting: Family Medicine

## 2022-08-12 ENCOUNTER — Encounter: Payer: Self-pay | Admitting: Family Medicine

## 2022-08-12 VITALS — BP 142/88 | HR 69 | Temp 98.5°F | Ht 69.0 in | Wt 286.5 lb

## 2022-08-12 DIAGNOSIS — I10 Essential (primary) hypertension: Secondary | ICD-10-CM

## 2022-08-12 DIAGNOSIS — E1165 Type 2 diabetes mellitus with hyperglycemia: Secondary | ICD-10-CM | POA: Diagnosis not present

## 2022-08-12 DIAGNOSIS — Z7984 Long term (current) use of oral hypoglycemic drugs: Secondary | ICD-10-CM | POA: Diagnosis not present

## 2022-08-12 MED ORDER — OLMESARTAN MEDOXOMIL 40 MG PO TABS
40.0000 mg | ORAL_TABLET | Freq: Every day | ORAL | 1 refills | Status: DC
Start: 2022-08-12 — End: 2022-08-12

## 2022-08-12 MED ORDER — OLMESARTAN MEDOXOMIL 40 MG PO TABS
40.0000 mg | ORAL_TABLET | Freq: Every day | ORAL | 1 refills | Status: DC
Start: 2022-08-12 — End: 2023-04-14

## 2022-08-12 MED ORDER — ROSUVASTATIN CALCIUM 20 MG PO TABS: ORAL_TABLET | ORAL | 3 refills | Status: DC

## 2022-08-12 NOTE — Progress Notes (Signed)
Chief Complaint  Patient presents with   Follow-up    Subjective Travis Fields is a 57 y.o. male who presents for hypertension follow up. He does monitor home blood pressures. Blood pressures ranging from 140's/90's on average. He is compliant with medications- olmesartan 20 mg/d. Patient has these side effects of medication: none He is sometimes adhering to a healthy diet overall. Current exercise: walking No CP or SOB.    Past Medical History:  Diagnosis Date   Allergy    Asthma    Back pain    Bilateral swelling of feet    Chronic kidney disease    kidney stones    Essential hypertension 03/29/2017   GERD (gastroesophageal reflux disease)    dx'd with GERD 10- 12 yrs ago with EGD    Gout    Heartburn    High cholesterol    History of chicken pox    History of kidney stones    History of shingles    Hypertension    Joint pain    Morbid obesity (HCC) 12/05/2015   OSA on CPAP 12/05/2015   Right foot pain 06/07/2017   Seasonal allergies    Sleep apnea    wears cpap    SOB (shortness of breath)    Swallowing difficulty    Type 2 diabetes mellitus with hyperglycemia, without long-term current use of insulin (HCC) 05/29/2020    Exam BP (!) 142/88 (BP Location: Left Arm, Cuff Size: Large)   Pulse 69   Temp 98.5 F (36.9 C) (Oral)   Ht 5\' 9"  (1.753 m)   Wt 286 lb 8 oz (130 kg)   SpO2 94%   BMI 42.31 kg/m  General:  well developed, well nourished, in no apparent distress Heart: RRR, no bruits, no LE edema Lungs: clear to auscultation, no accessory muscle use Psych: well oriented with normal range of affect and appropriate judgment/insight  Essential hypertension - Plan: olmesartan (BENICAR) 40 MG tablet  Type 2 diabetes mellitus with hyperglycemia, without long-term current use of insulin (HCC) - Plan: rosuvastatin (CRESTOR) 20 MG tablet  Chronic, uncontrolled. Increase Benicar from 20 mg/d to 40 mg/d. Monitor BP at home. Send message w BP readings in 2 weeks.  Counseled on diet and exercise. F/u in 1 mo. The patient voiced understanding and agreement to the plan.  Jilda Roche [place], DO 08/12/22  8:54 AM

## 2022-08-12 NOTE — Addendum Note (Signed)
Addended by: Scharlene Gloss B on: 08/12/2022 03:12 PM   Modules accepted: Orders

## 2022-08-12 NOTE — Patient Instructions (Signed)
Keep the diet clean and stay active.  Check your blood pressures 2-3 times per week, alternating the time of day you check it. If it is high, considering waiting 1-2 minutes and rechecking. If it gets higher, your anxiety is likely creeping up and we should avoid rechecking.   Send me some blood pressure readings on MyChart in 2 weeks please.   Let us know if you need anything.

## 2022-08-22 ENCOUNTER — Other Ambulatory Visit: Payer: Self-pay | Admitting: Family Medicine

## 2022-08-22 DIAGNOSIS — E1165 Type 2 diabetes mellitus with hyperglycemia: Secondary | ICD-10-CM

## 2022-09-14 ENCOUNTER — Other Ambulatory Visit: Payer: Self-pay | Admitting: Family Medicine

## 2022-09-14 DIAGNOSIS — E1165 Type 2 diabetes mellitus with hyperglycemia: Secondary | ICD-10-CM

## 2022-11-09 ENCOUNTER — Other Ambulatory Visit: Payer: Self-pay | Admitting: Family Medicine

## 2022-11-09 ENCOUNTER — Encounter: Payer: Self-pay | Admitting: Family Medicine

## 2022-11-09 MED ORDER — FLUTICASONE PROPIONATE HFA 110 MCG/ACT IN AERO: INHALATION_SPRAY | RESPIRATORY_TRACT | 3 refills | Status: DC

## 2022-11-11 ENCOUNTER — Other Ambulatory Visit: Payer: Self-pay | Admitting: Family Medicine

## 2022-11-11 DIAGNOSIS — J4520 Mild intermittent asthma, uncomplicated: Secondary | ICD-10-CM | POA: Insufficient documentation

## 2022-11-11 MED ORDER — BECLOMETHASONE DIPROP HFA 40 MCG/ACT IN AERB: INHALATION_SPRAY | RESPIRATORY_TRACT | 5 refills | Status: DC

## 2022-12-15 ENCOUNTER — Other Ambulatory Visit: Payer: Self-pay | Admitting: Family Medicine

## 2022-12-15 DIAGNOSIS — E1165 Type 2 diabetes mellitus with hyperglycemia: Secondary | ICD-10-CM

## 2023-03-02 ENCOUNTER — Other Ambulatory Visit: Payer: Self-pay | Admitting: Family Medicine

## 2023-03-02 DIAGNOSIS — M109 Gout, unspecified: Secondary | ICD-10-CM

## 2023-03-19 ENCOUNTER — Encounter: Payer: Self-pay | Admitting: Family Medicine

## 2023-03-19 ENCOUNTER — Telehealth (INDEPENDENT_AMBULATORY_CARE_PROVIDER_SITE_OTHER): Payer: Self-pay | Admitting: Family Medicine

## 2023-03-19 DIAGNOSIS — J4521 Mild intermittent asthma with (acute) exacerbation: Secondary | ICD-10-CM

## 2023-03-19 MED ORDER — PREDNISONE 20 MG PO TABS
40.0000 mg | ORAL_TABLET | Freq: Every day | ORAL | 0 refills | Status: AC
Start: 2023-03-19 — End: 2023-03-24

## 2023-03-19 MED ORDER — FLUTICASONE PROPIONATE HFA 110 MCG/ACT IN AERO: INHALATION_SPRAY | RESPIRATORY_TRACT | 2 refills | Status: DC

## 2023-03-19 NOTE — Progress Notes (Signed)
 Chief Complaint  Patient presents with   Wheezing    Wheezing     Fairy Hummer here for URI complaints. We are interacting via web portal for an electronic face-to-face visit. I verified patient's ID using 2 identifiers. Patient agreed to proceed with visit via this method. Patient is at home, I am at office. Patient and I are present for visit.   Duration: 2 months  Associated symptoms: rhinorrhea, wheezing, and coughing Denies: sinus congestion, sinus pain, itchy watery eyes, ear pain, ear drainage, sore throat, shortness of breath, myalgia, and fevers Treatment to date: Mucinex Sick contacts: No  Past Medical History:  Diagnosis Date   Allergy    Asthma    Back pain    Bilateral swelling of feet    Chronic kidney disease    kidney stones    Essential hypertension 03/29/2017   GERD (gastroesophageal reflux disease)    dx'd with GERD 10- 12 yrs ago with EGD    Gout    Heartburn    High cholesterol    History of chicken pox    History of kidney stones    History of shingles    Hypertension    Joint pain    Morbid obesity (HCC) 12/05/2015   OSA on CPAP 12/05/2015   Right foot pain 06/07/2017   Seasonal allergies    Sleep apnea    wears cpap    SOB (shortness of breath)    Swallowing difficulty    Type 2 diabetes mellitus with hyperglycemia, without long-term current use of insulin (HCC) 05/29/2020    Objective No conversational dyspnea Age appropriate judgment and insight Nml affect and mood  Mild intermittent asthma with acute exacerbation - Plan: predniSONE  (DELTASONE ) 20 MG tablet  Exacerbation of chronic issue. 5 d pred burst 40 mg/d. ICS for yellow zone situations. Continue to push fluids, practice good hand hygiene, cover mouth if coughing. F/u prn. If starting to experience fevers, shaking, or shortness of breath, seek immediate care. Pt voiced understanding and agreement to the plan.  Mabel [place], DO 03/19/23 1:35 PM

## 2023-03-20 ENCOUNTER — Other Ambulatory Visit: Payer: Self-pay | Admitting: Family Medicine

## 2023-03-20 DIAGNOSIS — E1165 Type 2 diabetes mellitus with hyperglycemia: Secondary | ICD-10-CM

## 2023-03-22 ENCOUNTER — Telehealth: Payer: Self-pay

## 2023-03-22 NOTE — Telephone Encounter (Signed)
 PA initiated via Covermymeds; KEY: [id number]. Awaiting determination.

## 2023-03-23 NOTE — Telephone Encounter (Addendum)
 PA cancelled.   Patient not Eligible (does not have coverage with the payer)

## 2023-03-24 NOTE — Telephone Encounter (Signed)
Sent pt mychart message to let him know

## 2023-04-14 ENCOUNTER — Other Ambulatory Visit: Payer: Self-pay

## 2023-04-14 DIAGNOSIS — M109 Gout, unspecified: Secondary | ICD-10-CM

## 2023-04-14 DIAGNOSIS — I10 Essential (primary) hypertension: Secondary | ICD-10-CM

## 2023-04-14 MED ORDER — ALLOPURINOL 100 MG PO TABS: ORAL_TABLET | ORAL | 6 refills | Status: DC

## 2023-04-14 MED ORDER — OLMESARTAN MEDOXOMIL 40 MG PO TABS: 40.0000 mg | ORAL_TABLET | Freq: Every day | ORAL | 3 refills | Status: DC

## 2023-06-25 ENCOUNTER — Other Ambulatory Visit: Payer: Self-pay | Admitting: Family Medicine

## 2023-06-25 DIAGNOSIS — E1165 Type 2 diabetes mellitus with hyperglycemia: Secondary | ICD-10-CM

## 2023-07-28 ENCOUNTER — Other Ambulatory Visit: Payer: Self-pay

## 2023-07-28 ENCOUNTER — Ambulatory Visit (INDEPENDENT_AMBULATORY_CARE_PROVIDER_SITE_OTHER): Payer: Self-pay | Admitting: Family Medicine

## 2023-07-28 ENCOUNTER — Ambulatory Visit: Payer: Self-pay | Admitting: Family Medicine

## 2023-07-28 ENCOUNTER — Other Ambulatory Visit: Payer: Self-pay | Admitting: Family Medicine

## 2023-07-28 ENCOUNTER — Encounter: Payer: Self-pay | Admitting: Family Medicine

## 2023-07-28 VITALS — BP 130/82 | HR 87 | Temp 98.0°F | Resp 16 | Ht 69.0 in | Wt 306.0 lb

## 2023-07-28 DIAGNOSIS — E1165 Type 2 diabetes mellitus with hyperglycemia: Secondary | ICD-10-CM

## 2023-07-28 DIAGNOSIS — M109 Gout, unspecified: Secondary | ICD-10-CM

## 2023-07-28 DIAGNOSIS — Z1159 Encounter for screening for other viral diseases: Secondary | ICD-10-CM | POA: Diagnosis not present

## 2023-07-28 DIAGNOSIS — E559 Vitamin D deficiency, unspecified: Secondary | ICD-10-CM | POA: Diagnosis not present

## 2023-07-28 DIAGNOSIS — Z23 Encounter for immunization: Secondary | ICD-10-CM

## 2023-07-28 DIAGNOSIS — Z0000 Encounter for general adult medical examination without abnormal findings: Secondary | ICD-10-CM

## 2023-07-28 LAB — COMPREHENSIVE METABOLIC PANEL WITH GFR
ALT: 44 U/L (ref 0–53)
AST: 37 U/L (ref 0–37)
Albumin: 4.8 g/dL (ref 3.5–5.2)
Alkaline Phosphatase: 66 U/L (ref 39–117)
BUN: 18 mg/dL (ref 6–23)
CO2: 23 meq/L (ref 19–32)
Calcium: 10.1 mg/dL (ref 8.4–10.5)
Chloride: 104 meq/L (ref 96–112)
Creatinine, Ser: 1.17 mg/dL (ref 0.40–1.50)
GFR: 69.47 mL/min (ref >60.00)
Glucose, Bld: 99 mg/dL (ref 70–99)
Potassium: 4.8 meq/L (ref 3.5–5.1)
Sodium: 141 meq/L (ref 135–145)
Total Bilirubin: 0.9 mg/dL (ref 0.2–1.2)
Total Protein: 7.6 g/dL (ref 6.0–8.3)

## 2023-07-28 LAB — CBC
HCT: 50.1 % (ref 39.0–52.0)
Hemoglobin: 16.6 g/dL (ref 13.0–17.0)
MCHC: 33.1 g/dL (ref 30.0–36.0)
MCV: 89.7 fl (ref 78.0–100.0)
Platelets: 156 10*3/uL (ref 150.0–400.0)
RBC: 5.59 Mil/uL (ref 4.22–5.81)
RDW: 13.8 % (ref 11.5–15.5)
WBC: 6.7 10*3/uL (ref 4.0–10.5)

## 2023-07-28 LAB — LIPID PANEL
Cholesterol: 118 mg/dL (ref 0–200)
HDL: 41.1 mg/dL (ref >39.00)
LDL Cholesterol: 62 mg/dL (ref 0–99)
NonHDL: 77.25
Total CHOL/HDL Ratio: 3
Triglycerides: 76 mg/dL (ref 0.0–149.0)
VLDL: 15.2 mg/dL (ref 0.0–40.0)

## 2023-07-28 LAB — MICROALBUMIN / CREATININE URINE RATIO
Creatinine,U: 107.7 mg/dL
Microalb Creat Ratio: 16.8 mg/g (ref 0.0–30.0)
Microalb, Ur: 1.8 mg/dL (ref 0.0–1.9)

## 2023-07-28 LAB — HEMOGLOBIN A1C: Hgb A1c MFr Bld: 6.4 % (ref 4.6–6.5)

## 2023-07-28 LAB — VITAMIN D 25 HYDROXY (VIT D DEFICIENCY, FRACTURES): VITD: 24.73 ng/mL — ABNORMAL LOW (ref 30.00–100.00)

## 2023-07-28 LAB — URIC ACID: Uric Acid, Serum: 8.3 mg/dL — ABNORMAL HIGH (ref 4.0–7.8)

## 2023-07-28 MED ORDER — METFORMIN HCL 500 MG PO TABS: 500.0000 mg | ORAL_TABLET | Freq: Two times a day (BID) | ORAL | 2 refills | Status: DC

## 2023-07-28 MED ORDER — ALLOPURINOL 100 MG PO TABS: 200.0000 mg | ORAL_TABLET | Freq: Every day | ORAL | 1 refills | Status: DC

## 2023-07-28 NOTE — Addendum Note (Signed)
 Addended by: Marolyn Urschel M on: 07/28/2023 10:54 AM   Modules accepted: Orders

## 2023-07-28 NOTE — Progress Notes (Signed)
 Chief Complaint  Patient presents with   Annual Exam    CPE    Well Male Travis Fields is here for a complete physical.   His last physical was >1 year ago.  Current diet: in general, diet is OK.  Current exercise: walking Weight trend: increased Fatigue out of ordinary? No. Seat belt? Yes.   Advanced directive? No  Health maintenance Shingrix- No Colonoscopy- Yes Tetanus- Yes HIV- Yes Hep C- No  Past Medical History:  Diagnosis Date   Allergy    Asthma    Back pain    Bilateral swelling of feet    Chronic kidney disease    kidney stones    Essential hypertension 03/29/2017   GERD (gastroesophageal reflux disease)    dx'd with GERD 10- 12 yrs ago with EGD    Gout    Heartburn    High cholesterol    History of chicken pox    History of kidney stones    History of shingles    Hypertension    Joint pain    Morbid obesity (HCC) 12/05/2015   OSA on CPAP 12/05/2015   Right foot pain 06/07/2017   Seasonal allergies    Sleep apnea    wears cpap    SOB (shortness of breath)    Swallowing difficulty    Type 2 diabetes mellitus with hyperglycemia, without long-term current use of insulin (HCC) 05/29/2020      Past Surgical History:  Procedure Laterality Date   INNER EAR SURGERY     UPPER GASTROINTESTINAL ENDOSCOPY      Medications  Current Outpatient Medications on File Prior to Visit  Medication Sig Dispense Refill   allopurinol  (ZYLOPRIM ) 100 MG tablet TAKE 1 TABLET(100 MG) BY MOUTH DAILY 30 tablet 6   cetirizine (ZYRTEC) 10 MG tablet Take 10 mg by mouth daily.     esomeprazole (NEXIUM) 20 MG capsule Take 20 mg by mouth daily at 12 noon.     fluticasone  (FLOVENT  HFA) 110 MCG/ACT inhaler Take 2 puffs twice daily when you are in your "yellow" zone. 1 each 2   metFORMIN  (GLUCOPHAGE ) 500 MG tablet Take 1 tablet (500 mg total) by mouth 2 (two) times daily with a meal. 60 tablet 0   Multiple Vitamins-Minerals (MENS MULTIVITAMIN) TABS Take 1 capsule by mouth daily.  Unknown strength     olmesartan  (BENICAR ) 40 MG tablet Take 1 tablet (40 mg total) by mouth daily. 90 tablet 3   rosuvastatin  (CRESTOR ) 20 MG tablet TAKE 1 TABLET(20 MG) BY MOUTH DAILY 90 tablet 3   Allergies No Known Allergies  Family History Family History  Adopted: Yes  Problem Relation Age of Onset   Cancer Neg Hx     Review of Systems: Constitutional:  no fevers Eye:  no recent significant change in vision Ear/Nose/Mouth/Throat:  Ears:  no hearing loss Nose/Mouth/Throat:  no complaints of nasal congestion, no sore throat Cardiovascular:  no chest pain Respiratory:  no shortness of breath Gastrointestinal:  no change in bowel habits GU:  Male: negative for dysuria, frequency Musculoskeletal/Extremities:  no joint pain Integumentary (Skin/Breast):  no abnormal skin lesions reported Neurologic:  no headaches Endocrine: No unexpected weight changes Hematologic/Lymphatic:  no abnormal bleeding  Exam BP 130/82 (BP Location: Left Arm, Patient Position: Sitting)   Pulse 87   Temp 98 F (36.7 C) (Oral)   Resp 16   Ht 5\' 9"  (1.753 m)   Wt (!) 306 lb (138.8 kg)   SpO2 98%  BMI 45.19 kg/m  General:  well developed, well nourished, in no apparent distress Skin:  no significant moles, warts, or growths Head:  no masses, lesions, or tenderness Eyes:  pupils equal and round, sclera anicteric without injection Ears:  canals without lesions, TMs shiny without retraction, no obvious effusion, no erythema Nose:  nares patent, mucosa normal Throat/Pharynx:  lips and gingiva without lesion; tongue and uvula midline; non-inflamed pharynx; no exudates or postnasal drainage Neck: neck supple without adenopathy, thyromegaly, or masses Cardiac: RRR, no bruits, no LE edema Lungs:  clear to auscultation, breath sounds equal bilaterally, no respiratory distress Abdomen: BS+, soft, non-tender, non-distended, no masses or organomegaly noted Rectal: Deferred Musculoskeletal:  symmetrical  muscle groups noted without atrophy or deformity Neuro: sensation intact to pinprick b/l over feet; gait normal; deep tendon reflexes normal and symmetric Psych: well oriented with normal range of affect and appropriate judgment/insight  Assessment and Plan  Well adult exam - Plan: CBC, Comprehensive metabolic panel with GFR, Lipid panel  Type 2 diabetes mellitus with hyperglycemia, without long-term current use of insulin (HCC) - Plan: Hemoglobin A1c, Microalbumin / creatinine urine ratio  Gout of multiple sites, unspecified cause, unspecified chronicity - Plan: Uric acid  Vitamin D  deficiency - Plan: VITAMIN D  25 Hydroxy (Vit-D Deficiency, Fractures)  Encounter for hepatitis C screening test for low risk patient - Plan: Hepatitis C antibody   Well 58 y.o. male. Counseled on diet and exercise. Counseled on risks and benefits of prostate cancer screening with PSA. The patient agrees to forego testing. Rec'd he schedule his eye exam.  Advanced directive form provided today.  Immunizations, labs, and further orders as above. Follow up in 6 mo. The patient voiced understanding and agreement to the plan.  Shellie Dials [place], DO 07/28/23 10:13 AM

## 2023-07-28 NOTE — Patient Instructions (Addendum)
 Give us  2-3 business days to get the results of your labs back.   Keep the diet clean and stay active.  Please get me a copy of your advanced directive form at your convenience.   Please schedule your eye exam.   Let us  know if you need anything.

## 2023-07-29 LAB — HEPATITIS C ANTIBODY: Hepatitis C Ab: NONREACTIVE

## 2023-08-25 ENCOUNTER — Other Ambulatory Visit

## 2023-09-15 ENCOUNTER — Telehealth: Payer: Self-pay

## 2023-09-15 ENCOUNTER — Telehealth: Payer: Self-pay | Admitting: Family Medicine

## 2023-09-15 DIAGNOSIS — E1165 Type 2 diabetes mellitus with hyperglycemia: Secondary | ICD-10-CM

## 2023-09-15 MED ORDER — ROSUVASTATIN CALCIUM 20 MG PO TABS: 20.0000 mg | ORAL_TABLET | Freq: Every day | ORAL | 1 refills | Status: DC

## 2023-09-15 NOTE — Telephone Encounter (Unsigned)
 Copied from CRM [id number]. Topic: Clinical - Medication Refill >> Sep 15, 2023  9:58 AM Chasity T wrote: Medication: rosuvastatin  (CRESTOR ) 20 MG tablet   Has the patient contacted their pharmacy? Yes  This is the patient's preferred pharmacy:  [id number] - [place], [place] - [address] [place] [address] Phone: [phone] Fax: [phone]  Is this the correct pharmacy for this prescription? Yes If no, delete pharmacy and type the correct one.   Has the prescription been filled recently? Yes  Is the patient out of the medication? Yes  Has the patient been seen for an appointment in the last year OR does the patient have an upcoming appointment? Yes  Can we respond through MyChart? Yes  Agent: Please be advised that Rx refills may take up to 3 business days. We ask that you follow-up with your pharmacy.

## 2023-09-15 NOTE — Telephone Encounter (Signed)
 Copied from CRM [id number]. Topic: Clinical - Medication Refill >> Sep 15, 2023  9:58 AM Chasity T wrote: Medication: rosuvastatin  (CRESTOR ) 20 MG tablet   Has the patient contacted their pharmacy? Yes  This is the patient's preferred pharmacy:  [id number] - [place], [place] - [address] [place] [address] Phone: [phone] Fax: [phone]  Is this the correct pharmacy for this prescription? Yes If no, delete pharmacy and type the correct one.   Has the prescription been filled recently? Yes  Is the patient out of the medication? Yes  Has the patient been seen for an appointment in the last year OR does the patient have an upcoming appointment? Yes  Can we respond through MyChart? Yes  Agent: Please be advised that Rx refills may take up to 3 business days. We ask that you follow-up with your pharmacy.

## 2023-09-21 ENCOUNTER — Other Ambulatory Visit (INDEPENDENT_AMBULATORY_CARE_PROVIDER_SITE_OTHER)

## 2023-09-21 ENCOUNTER — Ambulatory Visit: Payer: Self-pay | Admitting: Family Medicine

## 2023-09-21 DIAGNOSIS — E559 Vitamin D deficiency, unspecified: Secondary | ICD-10-CM | POA: Diagnosis not present

## 2023-09-21 LAB — URIC ACID: Uric Acid, Serum: 5.4 mg/dL (ref 4.0–7.8)

## 2023-11-25 ENCOUNTER — Encounter: Payer: Self-pay | Admitting: Family Medicine

## 2023-12-01 ENCOUNTER — Other Ambulatory Visit: Payer: Self-pay

## 2023-12-01 ENCOUNTER — Encounter: Payer: Self-pay | Admitting: Family Medicine

## 2023-12-01 ENCOUNTER — Ambulatory Visit: Admitting: Family Medicine

## 2023-12-01 VITALS — BP 134/82 | HR 70 | Temp 98.0°F | Resp 16 | Ht 69.0 in | Wt 307.0 lb

## 2023-12-01 DIAGNOSIS — G4733 Obstructive sleep apnea (adult) (pediatric): Secondary | ICD-10-CM | POA: Diagnosis not present

## 2023-12-01 DIAGNOSIS — I10 Essential (primary) hypertension: Secondary | ICD-10-CM

## 2023-12-01 DIAGNOSIS — E1165 Type 2 diabetes mellitus with hyperglycemia: Secondary | ICD-10-CM

## 2023-12-01 DIAGNOSIS — Z7985 Long-term (current) use of injectable non-insulin antidiabetic drugs: Secondary | ICD-10-CM | POA: Diagnosis not present

## 2023-12-01 MED ORDER — ESOMEPRAZOLE MAGNESIUM 20 MG PO CPDR: 20.0000 mg | DELAYED_RELEASE_CAPSULE | Freq: Every day | ORAL | 1 refills | Status: AC

## 2023-12-01 MED ORDER — OLMESARTAN MEDOXOMIL 40 MG PO TABS: 40.0000 mg | ORAL_TABLET | Freq: Every day | ORAL | 3 refills | Status: AC

## 2023-12-01 MED ORDER — TIRZEPATIDE 2.5 MG/0.5ML ~~LOC~~ SOAJ: 2.5000 mg | SUBCUTANEOUS | 0 refills | Status: AC

## 2023-12-01 MED ORDER — METFORMIN HCL 500 MG PO TABS: 500.0000 mg | ORAL_TABLET | Freq: Two times a day (BID) | ORAL | 2 refills | Status: AC

## 2023-12-01 MED ORDER — TIRZEPATIDE 7.5 MG/0.5ML ~~LOC~~ SOAJ: 7.5000 mg | SUBCUTANEOUS | 0 refills | Status: DC

## 2023-12-01 MED ORDER — TIRZEPATIDE 5 MG/0.5ML ~~LOC~~ SOAJ: 5.0000 mg | SUBCUTANEOUS | 0 refills | Status: AC

## 2023-12-01 MED ORDER — ROSUVASTATIN CALCIUM 20 MG PO TABS: 20.0000 mg | ORAL_TABLET | Freq: Every day | ORAL | 1 refills | Status: AC

## 2023-12-01 NOTE — Progress Notes (Signed)
 Subjective:   Chief Complaint  Patient presents with   Medication Assistance    Weight Loss Medication     Travis Fields is a 58 y.o. male here for follow-up of diabetes.   Travis Fields does not routinely ck his sugars.  Patient does not require insulin.   Medications include: metformin  1000 mg bid Diet is OK.  Exercise: some walking  Past Medical History:  Diagnosis Date   Allergy    Asthma    Back pain    Bilateral swelling of feet    Chronic kidney disease    kidney stones    Essential hypertension 03/29/2017   GERD (gastroesophageal reflux disease)    dx'd with GERD 10- 12 yrs ago with EGD    Gout    Heartburn    High cholesterol    History of chicken pox    History of kidney stones    History of shingles    Hypertension    Joint pain    Morbid obesity (HCC) 12/05/2015   OSA on CPAP 12/05/2015   Right foot pain 06/07/2017   Seasonal allergies    Sleep apnea    wears cpap    SOB (shortness of breath)    Swallowing difficulty    Type 2 diabetes mellitus with hyperglycemia, without long-term current use of insulin (HCC) 05/29/2020     Related testing: Retinal exam: Due Pneumovax: done  Objective:  BP 134/82 (BP Location: Left Arm, Patient Position: Sitting)   Pulse 70   Temp 98 F (36.7 C) (Oral)   Resp 16   Ht 5' 9 (1.753 m)   Wt (!) 307 lb (139.3 kg)   SpO2 98%   BMI 45.34 kg/m  General:  Well developed, well nourished, in no apparent distress Lungs: no access msc use Psych: Age appropriate judgment and insight  Assessment:   Type 2 diabetes mellitus with hyperglycemia, without long-term current use of insulin (HCC) - Plan: tirzepatide  (MOUNJARO ) 2.5 MG/0.5ML Pen, tirzepatide  (MOUNJARO ) 5 MG/0.5ML Pen, tirzepatide  (MOUNJARO ) 7.5 MG/0.5ML Pen  OSA on CPAP   Plan:   Chronic, not ideally controlled.  I think his sugar control will be much better with more weight loss.  Will start Mounjaro  2.5 mg weekly and titrate up.  I will see him as originally  scheduled in November and we will recheck his blood work.  Hopefully we can take him off of his metformin .  Even more hopeful he will be able to stop using his CPAP and maybe be able to come off of the Benicar .  Counseled on diet and exercise. F/u as originally scheduled. The patient voiced understanding and agreement to the plan.  Mabel [place], DO 12/01/23 3:35 PM

## 2023-12-01 NOTE — Patient Instructions (Signed)
 Keep the diet clean and stay active.  Let me know if there are cost issues.  Let us know if you need anything.

## 2023-12-03 ENCOUNTER — Encounter: Payer: Self-pay | Admitting: Family Medicine

## 2023-12-06 ENCOUNTER — Other Ambulatory Visit (HOSPITAL_COMMUNITY): Payer: Self-pay

## 2023-12-07 ENCOUNTER — Other Ambulatory Visit (HOSPITAL_COMMUNITY): Payer: Self-pay

## 2023-12-07 ENCOUNTER — Telehealth: Payer: Self-pay

## 2023-12-07 NOTE — Telephone Encounter (Signed)
 Pharmacy Patient Advocate Encounter   Received notification from Patient Advice Request messages that prior authorization for Mounjaro  2.5mg /0.54ml is required/requested.   Insurance verification completed.   The patient is insured through [employer]  .   Per test claim: PA required; PA started via CoverMyMeds. KEY [id number] . Waiting for clinical questions to populate.

## 2023-12-07 NOTE — Telephone Encounter (Signed)
 Clinical questions answered and PA submitted.

## 2023-12-08 ENCOUNTER — Other Ambulatory Visit (HOSPITAL_COMMUNITY): Payer: Self-pay

## 2023-12-08 NOTE — Telephone Encounter (Signed)
 Pharmacy Patient Advocate Encounter  Received notification from [employer] that Prior Authorization for Mounjaro  2.5mg /0.32ml has been APPROVED from 12/08/23 to 12/07/24   PA #/Case ID/Reference #: [id number]  Left a message at [facility] to notify of the approval.

## 2024-01-17 ENCOUNTER — Encounter: Payer: Self-pay | Admitting: Family Medicine

## 2024-01-28 ENCOUNTER — Ambulatory Visit: Admitting: Family Medicine

## 2024-02-08 ENCOUNTER — Encounter: Payer: Self-pay | Admitting: Family Medicine

## 2024-02-08 DIAGNOSIS — M109 Gout, unspecified: Secondary | ICD-10-CM

## 2024-02-08 MED ORDER — ALLOPURINOL 100 MG PO TABS: 200.0000 mg | ORAL_TABLET | Freq: Every day | ORAL | 1 refills | Status: AC

## 2024-02-18 ENCOUNTER — Ambulatory Visit: Payer: Self-pay | Admitting: Family Medicine

## 2024-02-18 ENCOUNTER — Encounter: Payer: Self-pay | Admitting: Family Medicine

## 2024-02-18 ENCOUNTER — Ambulatory Visit: Admitting: Family Medicine

## 2024-02-18 VITALS — BP 130/78 | HR 81 | Temp 98.0°F | Resp 16 | Ht 69.0 in | Wt 296.6 lb

## 2024-02-18 DIAGNOSIS — E1165 Type 2 diabetes mellitus with hyperglycemia: Secondary | ICD-10-CM

## 2024-02-18 DIAGNOSIS — I10 Essential (primary) hypertension: Secondary | ICD-10-CM | POA: Diagnosis not present

## 2024-02-18 DIAGNOSIS — Z7985 Long-term (current) use of injectable non-insulin antidiabetic drugs: Secondary | ICD-10-CM | POA: Diagnosis not present

## 2024-02-18 LAB — COMPREHENSIVE METABOLIC PANEL WITH GFR
ALT: 43 U/L (ref 0–53)
AST: 32 U/L (ref 0–37)
Albumin: 4.6 g/dL (ref 3.5–5.2)
Alkaline Phosphatase: 63 U/L (ref 39–117)
BUN: 16 mg/dL (ref 6–23)
CO2: 31 meq/L (ref 19–32)
Calcium: 9.9 mg/dL (ref 8.4–10.5)
Chloride: 102 meq/L (ref 96–112)
Creatinine, Ser: 1.01 mg/dL (ref 0.40–1.50)
GFR: 82.55 mL/min (ref >60.00)
Glucose, Bld: 92 mg/dL (ref 70–99)
Potassium: 4.5 meq/L (ref 3.5–5.1)
Sodium: 140 meq/L (ref 135–145)
Total Bilirubin: 1.1 mg/dL (ref 0.2–1.2)
Total Protein: 7.2 g/dL (ref 6.0–8.3)

## 2024-02-18 LAB — LIPID PANEL
Cholesterol: 98 mg/dL (ref 0–200)
HDL: 34.7 mg/dL — ABNORMAL LOW (ref >39.00)
LDL Cholesterol: 47 mg/dL (ref 0–99)
NonHDL: 63.71
Total CHOL/HDL Ratio: 3
Triglycerides: 84 mg/dL (ref 0.0–149.0)
VLDL: 16.8 mg/dL (ref 0.0–40.0)

## 2024-02-18 LAB — HEMOGLOBIN A1C: Hgb A1c MFr Bld: 6 % (ref 4.6–6.5)

## 2024-02-18 MED ORDER — AIRSUPRA 90-80 MCG/ACT IN AERO: 2.0000 | INHALATION_SPRAY | Freq: Four times a day (QID) | RESPIRATORY_TRACT | 2 refills | Status: AC | PRN

## 2024-02-18 MED ORDER — TIRZEPATIDE 15 MG/0.5ML ~~LOC~~ SOAJ: 15.0000 mg | SUBCUTANEOUS | 1 refills | Status: AC

## 2024-02-18 MED ORDER — TIRZEPATIDE 10 MG/0.5ML ~~LOC~~ SOAJ: 10.0000 mg | SUBCUTANEOUS | 0 refills | Status: AC

## 2024-02-18 MED ORDER — TIRZEPATIDE 12.5 MG/0.5ML ~~LOC~~ SOAJ: 12.5000 mg | SUBCUTANEOUS | 0 refills | Status: AC

## 2024-02-18 MED ORDER — QVAR REDIHALER 40 MCG/ACT IN AERB: 2.0000 | INHALATION_SPRAY | Freq: Two times a day (BID) | RESPIRATORY_TRACT | 2 refills | Status: AC

## 2024-02-18 NOTE — Patient Instructions (Addendum)
Give [place] 2-3 business days to get the results of your labs back.   Keep the diet clean and stay active.  Strong work with your weight loss.   Please schedule your eye exam.   Let us know if you need anything.

## 2024-02-18 NOTE — Progress Notes (Signed)
 Subjective:   Chief Complaint  Patient presents with   Follow-up    Follow Up    Travis Fields is a 58 y.o. male here for follow-up of diabetes.   Travis Fields does not routinely  Patient does not require insulin.   Medications include: Metformin  500 mg twice daily Mounjaro  7.5 mg/week.  Diet is healthier.  Exercise: some walking  Hypertension Patient presents for hypertension follow up. He does not monitor home blood pressures. He is compliant with medication- Benicar  20 mg/d. Patient has these side effects of medication: none Diet/exercise as above. No CP or SOB.  Past Medical History:  Diagnosis Date   Allergy    Asthma    Back pain    Bilateral swelling of feet    Chronic kidney disease    kidney stones    Essential hypertension 03/29/2017   GERD (gastroesophageal reflux disease)    dx'd with GERD 10- 12 yrs ago with EGD    Gout    Heartburn    High cholesterol    History of chicken pox    History of kidney stones    History of shingles    Hypertension    Joint pain    Morbid obesity (HCC) 12/05/2015   OSA on CPAP 12/05/2015   Right foot pain 06/07/2017   Seasonal allergies    Sleep apnea    wears cpap    SOB (shortness of breath)    Swallowing difficulty    Type 2 diabetes mellitus with hyperglycemia, without long-term current use of insulin (HCC) 05/29/2020     Related testing: Retinal exam: Due Pneumovax: done  Objective:  BP 130/78 (BP Location: Left Arm, Patient Position: Sitting)   Pulse 81   Temp 98 F (36.7 C) (Oral)   Resp 16   Ht 5' 9 (1.753 m)   Wt 296 lb 9.6 oz (134.5 kg)   SpO2 96%   BMI 43.80 kg/m  General:  Well developed, well nourished, in no apparent distress  Lungs:  CTAB, no access msc use Cardio:  RRR, no bruits, 1+ pitting b/l LE edema tapering at the knees Psych: Age appropriate judgment and insight  Assessment:   Type 2 diabetes mellitus with hyperglycemia, without long-term current use of insulin (HCC) - Plan:  Comprehensive metabolic panel with GFR, Lipid panel, Hemoglobin A1c  Essential hypertension   Plan:   Chronic, stable.  For now continue doses escalation of Mounjaro  starting 10 mg next week.  If A1c is controlled, we will drop the metformin .  Counseled on diet and exercise.  He needs to schedule his eye exam.  He promises to do so. Chronic, stable.  Continue olmesartan  40 mg daily.  He will monitor blood pressure at home intermittently and if starting to drop, he will let us  know. Flu shot politely declined. F/u in 6 mo. The patient voiced understanding and agreement to the plan.  Mabel [place], DO 02/18/24 9:25 AM

## 2024-08-29 ENCOUNTER — Encounter: Admitting: Family Medicine
